# Patient Record
Sex: Male | Born: 1940 | Race: White | Hispanic: No | Marital: Single | Smoking: Former smoker
Health system: Southern US, Community
[De-identification: ages and names within clinical notes are randomized; demographics above are authoritative.]

## PROBLEM LIST (undated history)

## (undated) DIAGNOSIS — E785 Hyperlipidemia, unspecified: Secondary | ICD-10-CM

## (undated) DIAGNOSIS — R42 Dizziness and giddiness: Secondary | ICD-10-CM

## (undated) DIAGNOSIS — G629 Polyneuropathy, unspecified: Secondary | ICD-10-CM

## (undated) DIAGNOSIS — N399 Disorder of urinary system, unspecified: Secondary | ICD-10-CM

## (undated) DIAGNOSIS — F419 Anxiety disorder, unspecified: Secondary | ICD-10-CM

## (undated) DIAGNOSIS — M1990 Unspecified osteoarthritis, unspecified site: Secondary | ICD-10-CM

## (undated) HISTORY — DX: Disorder of urinary system, unspecified: N39.9

## (undated) HISTORY — DX: Polyneuropathy, unspecified: G62.9

## (undated) HISTORY — PX: ROTATOR CUFF REPAIR: SHX139

## (undated) HISTORY — DX: Unspecified osteoarthritis, unspecified site: M19.90

## (undated) HISTORY — DX: Dizziness and giddiness: R42

---

## 2004-07-18 ENCOUNTER — Inpatient Hospital Stay (HOSPITAL_COMMUNITY): Admission: EM | Admit: 2004-07-18 | Discharge: 2004-07-19 | Payer: Self-pay | Admitting: Emergency Medicine

## 2004-12-22 ENCOUNTER — Ambulatory Visit (HOSPITAL_COMMUNITY): Admission: RE | Admit: 2004-12-22 | Discharge: 2004-12-22 | Payer: Self-pay | Admitting: Psychiatry

## 2005-05-24 ENCOUNTER — Encounter: Admission: RE | Admit: 2005-05-24 | Discharge: 2005-08-22 | Payer: Self-pay | Admitting: Otolaryngology

## 2008-05-01 ENCOUNTER — Encounter: Admission: RE | Admit: 2008-05-01 | Discharge: 2008-05-01 | Payer: Self-pay | Admitting: Family Medicine

## 2009-06-20 ENCOUNTER — Emergency Department (HOSPITAL_COMMUNITY): Admission: EM | Admit: 2009-06-20 | Discharge: 2009-06-20 | Payer: Self-pay | Admitting: Emergency Medicine

## 2010-06-07 HISTORY — PX: APPENDECTOMY: SHX54

## 2010-10-23 NOTE — Discharge Summary (Signed)
NAME:  [name], [name] NO.:  [id number]   MEDICAL RECORD NO.:  [id number]          PATIENT TYPE:  INP   LOCATION:  0370                         FACILITY:  [facility]   PHYSICIAN:  Isidor Holts, M.D.  DATE OF BIRTH:  Oct 28, 1940   DATE OF ADMISSION:  07/17/2004  DATE OF DISCHARGE:                                 DISCHARGE SUMMARY   PRIMARY CARE PHYSICIAN:  Merlene Laughter. Renae Gloss, M.D.   NEUROLOGIST:  Salvatore Marvel, M.D.   DISCHARGE DIAGNOSES:  1.  Acute labyrinthitis/vestibular neuronitis.  2.  Tic.  3.  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   DISCHARGE MEDICATIONS:  1.  Prednisone tapering course, i.e., 10 mg p.o. b.i.d. for two days; then 5      mg p.o. b.i.d. for two days; then 2.5 mg p.o. b.i.d. for one day; then      stop.  2.  Motrin 600 mg p.o. t.i.d. with food for one week only.  3.  Pimozide (Orap) 2 mg p.o. b.i.d.   PROCEDURES:  1.  CT brain, dated July 17, 2004. This showed no acute abnormality.      Also noted are small bilateral maxillary sinus retention cysts.  2.  Brain MRI, dated July 18, 2004. This showed no evidence of acute or      reversible process. There were minimal small vessel changes in the      hemispheric white  matter. There is no fluid in the middle ears per      nasal sinuses or mastoids.   CONSULTATIONS:  Dr. Ellison Carwin, neurologist.   ADMISSION HISTORY:  As per H&P notes of July 18, 2004; however, in  brief, this is a 70 year old Caucasian male who presented to the emergency  department with acute onset of dizziness on the day of admission. He denies  respiratory tract symptoms, although he had, had bilateral retroauricular  aches and pains for approximately one week prior. He had prior to this, been  under the care of Dr. Salvatore Marvel, neurologist, for management of tics and  had just recently been started on Butalbarbital. Vertigo was associated with  vomiting. The patient, however, denied visual  impairment, diminution of  auditory acuity, or tinnitus. He was admitted for further investigation,  evaluation, and management.   HOSPITAL COURSE:   PROBLEM #1:  Acute labyrinthitis/vestibular neuronitis. The patient  presented with acute onset of vertigo, although there is no discernible  history of upper respiratory tract infection.  Clinical history appears  consistent with acute vestibular neuronitis/labyrinthitis. He was managed  therefore with antiemetics and steroids with satisfactory clinical response.  Associated vomiting was managed with antiemetics and intravenous fluid  hydration. On July 18, 2004, his symptoms had practically subsided. The  patient was evaluated with brain CT scan/brain MRI scan which showed no  acute intracranial abnormality or concerning lesions.   PROBLEM #2:  Tics. Patient has a rather long-term history of tics, but  apparently had only relatively been evaluated by Dr. Salvatore Marvel,  neurologist, at [facility] and had only recently been commenced on the  Butalbarbital. This was discontinued at the time  of patient's admission. It  was felt that he would benefit from a neurologic consultation and this was  kindly provided by Dr. Sharene Skeans who started the patient on pimozide. He is  to continue this until re-evaluated by Dr. Anne Hahn.   PROBLEM #3:  Bilateral retroauricular pain/ache. Initial suspicion was of  possible mastoid sinusitis. However, imaging of the mastoid region did not  show any concerning abnormalities. Certainly, the patient has no discernible  tenderness in the mastoid regions bilaterally. However, he does experience  significant discomfort on palpation of the temporomandibular joints  bilaterally aggravated on opening and closing his mouth. It is possible that  this may be associated with TMJ arthritis and it is felt that the patient  would benefit from a dental consultation, which we expect his PMD to arrange  in due course.  Meanwhile, we have commenced him on Motrin 600 mg p.o. t.i.d.  with food for one week.   DISPOSITION:  The patient is completely asymptomatic on July 19, 2004.  He is therefore being discharged in satisfactory condition, particularly as  he is very keen to go home.   PAST MEDICAL HISTORY:  Not applicable.   ACTIVITY:  As tolerated.   DIET:  No restrictions.   WOUND CARE:  Not applicable.   SPECIAL INSTRUCTIONS:  The patient is instructed to take things easy, to  rest for the next two to three days. He will also need dental consultation  for possible TMJ arthritis. His PMD is to arrange this.   FOLLOWUP:  Dr. Anne Hahn in neurology, per appointment, dated July 28, 2004.  He is also instructed to follow up with PMD Dr. Andi Devon  within two weeks. He is to call and make this appointment. The patient has  verbalized understanding.      CO/MEDQ  D:  07/19/2004  T:  07/19/2004  Job:  [id number]   cc:   Merlene Laughter. Renae Gloss, M.D.  [address]  [address]  [place]  [place] [address]  Fax: [phone]   Salvatore Marvel  [address]  [place]  [place] [address]  Fax: [phone]   Deanna Artis. Sharene Skeans, M.D.  [address]  [address]  [place]  [place] [address]  Fax: [phone]

## 2010-10-23 NOTE — H&P (Signed)
NAME:  [name], [name]               ACCOUNT NO.:  [id number]   MEDICAL RECORD NO.:  [id number]          PATIENT TYPE:  INP   LOCATION:  0101                         FACILITY:  [facility]   PHYSICIAN:  Toby L. Rogers, D.O.   DATE OF BIRTH:  [birth year]   DATE OF ADMISSION:  07/17/2004  DATE OF DISCHARGE:                                HISTORY & PHYSICAL   PRIMARY CARE PHYSICIAN:  [name], M.D.   CHIEF COMPLAINT:  Vertigo.   HISTORY OF PRESENT ILLNESS:  [name] is a 69 year old Caucasian male who  presents to the ED with acute onset of dizziness that began Friday morning  at about 3 a.m.  He had associated nausea and vomiting.  He says that he is  dizzy regardless of whether he is lying down or sitting up.  It does seem to  get worse when he sits up.  He is most comfortable when he is lying flat  with his eyes closed.  He has been complaining of pain behind both ears for  about one or two weeks.  He has had no fever or chills, no drainage from his  ears.  There is post nasal drip.  He did develop a headache once the  dizziness developed.  No ringing in his ears.  No change in hearing or  vision.  The patient was recently evaluated by [name], neurologist, due  to tics.  The patient was started on Clonidine.  Apparently the neurologist  had suggested getting an MRI as an outpatient.  Currently, the patient is  quite dizzy and is lying still in his room.  He does continue to have nausea  and vomiting.   PAST MEDICAL HISTORY/PAST SURGICAL HISTORY:  1.  Arthroscopic surgery on his left knee.  2.  History of tics.  The patient is followed by [name], neurologist.   MEDICATIONS:  1.  Multivitamin.  2.  Clonidine.   ALLERGIES:  Seafood.   SOCIAL HISTORY:  No tobacco, alcohol or IV drug abuse.  The patient is  retired.  He was a school bus driver.   FAMILY HISTORY:  Coronary artery disease and stroke.  This is on his mother  and father's side.   REVIEW OF SYSTEMS:  A  complete review of systems was performed.  The review  is negative except as stated in the HPI.   PHYSICAL EXAMINATION:  VITAL SIGNS:  Temperature 97, blood pressure 162/86,  pulse 87, respiratory rate 20, O2 saturation 100% on room air.  HEENT:  Pupils were equally round and reactive to light.  Extraocular  muscles were intact.  There is no scleral icterus.  Tympanic membranes were  clear bilaterally.  There was a small amount of fluid behind the tympanic  membrane bilaterally.  There was no erythema.  Oropharynx is clear and  moist.  There was no erythema or thrush.  There was postnasal drip.  NECK:  No JVD, no carotid bruit, no adenopathy.  HEART:  Regular rate and rhythm.  No murmurs, rubs or gallops.  LUNGS:  Clear to auscultation bilaterally.  No  wheezes, rales or rhonchi.  ABDOMEN:  Positive bowel sounds, nontender, nondistended.  EXTREMITIES:  No edema.  NEUROLOGICAL:  Cranial nerves II-XII were grossly intact.  There was no  focal deficits.  There was no nystagmus.  DTRs were 2/4 in all extremities.  Strength was 5/5 in all extremities.  The patient was unable to stand for  Romberg.   LABORATORY DATA:  A CT scan of the head showed no acute abnormality.   ASSESSMENT AND PLAN:  1.  Acute labyrinthitis.  The patient had been complaining of pain behind      both ears for about a week or two now.  This appears to be most      consistent with acute labyrinthitis.  I will start the patient on Solu-      Medrol 125 mechanical ventilation IV every six hours.  This can later be      tapered to prednisone as an outpatient.  Likely the steroids will help      with the inflammation.  This infection is likely viral.  For the      patient's nausea and vomiting, because it is so severe, I will start him      on Ativan 2 mg every four hours as needed.  In addition, he will receive      Phenergan 12.5 mg IV every six hours as needed.  I will adjust these medications throughout the night to  control his  symptoms.  In the morning, we may consider getting a neurology consult just  to see what [name] has to say about his acute onset of vertigo.  She may  very well want to get the MRI while he is an inpatient.  Also, if his  dizziness persists, we may have to entertain the idea of a stroke being the  cause of this dizziness.  1.  The patient is a full code.      TLF/MEDQ  D:  07/18/2004  T:  07/18/2004  Job:  [id number]

## 2010-10-23 NOTE — Consult Note (Signed)
NAME:  [name], [name] NO.:  [id number]   MEDICAL RECORD NO.:  [id number]          PATIENT TYPE:  INP   LOCATION:  0370                         [facility]ACILITY:  [facility]   PHYSICIAN:  Deanna Artis. Hickling, M.D.DATE OF BIRTH:  [birth year]   DATE OF CONSULTATION:  07/18/2004  DATE OF DISCHARGE:                                   CONSULTATION   CHIEF COMPLAINT:  Vertigo and motor tics.   HISTORY OF PRESENT ILLNESS:  Mr. 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  These tended to worse  as the day went on and to extend to his face to involve twitching movements  of the buccal muscles as well as to a lesser extent the eyes.  The patient  did not have any vocal tics.   When patient was younger he had a tic disorder that involved both clearing  of his throat and hard blinking of his eyes as well as other movements.  This went away in his adolescence.  He has not had problems with that until  recently.   Patient was seen earlier this week by Salvatore Marvel of [place].  She felt  that the patient was having a tic disorder and placed him on butalbital in  an attempt to try to allow him to sleep and also to set him up for an MRI  scan of the brain.  Patient awakened Friday morning and had significant  vertigo, unsteadiness in his gait and falling.  He also complained of  bifrontal headaches that he felt emanated from the frequent tics.  Patient  was admitted to the hospital for further evaluation.  I was asked to see him  to determine the etiology of his dysfunction and make recommendations for  further workup and treatment.  Working diagnosis of his vertigo and  unsteadiness is vestibular neuronitis versus labyrinthitis.  He is being  treated currently with Ativan, Phenergan and corticosteroids.   PAST MEDICAL HISTORY:  The patient has had no serious illnesses,  injuries or  hospitalizations.   PAST SURGICAL HISTORY:  Arthroscopic surgery of his left knee for bony  fragments within the knee (1981-82).   MEDICATIONS:  Clonidine, multivitamin, butalbital.   CURRENT MEDICATIONS:  1.  Ativan 2 mg q.4h. p.r.n.  2.  Phenergan 12.5 mg IV q.6h. p.r.n.  3.  Solu Medrol 125 mg IV q.6h. which was dropped down to 60 mg q.6h. today.   Brain MRI scan without and with contrast has been ordered; it is pending at  this time.   ALLERGIES:  Drug allergies are none.   ENVIRONMENTAL ALLERGIES:  IODINE and SEAFOOD.   FAMILY HISTORY:  Remarkable for coronary artery disease in his mother.  He  has had multiple stents and stroke in his father who also had hypertension  and may have had diabetes.  There are no other first-degree relatives with  tics, although he remembers that his grandfather had tics.   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  He does not  use tobacco, alcohol or drugs.  He is married.  He has an adopted daughter  who is a Consulting civil engineer at [employer].   PHYSICAL EXAMINATION:  GENERAL:  This is a pleasant gentleman in no acute  distress.  Right-handed.  VITAL SIGNS:  Temperature 97.9, blood pressure 98/59, resting pulse 67,  respirations 20, oxygen saturation 100.  Height 72 inches, weight 192  pounds.  HEENT:  No signs of infection.  NECK:  Supple, full range of motion.  No cranial or cervical bruits.  LUNGS:  Clear to auscultation.  HEART:  No murmurs.  Pulses normal.  ABDOMEN:  Soft and nontender.  Bowel sounds normal.  EXTREMITIES:  Well formed without edema, cyanosis or alterations in tone or  tight heel cords.  NEUROLOGIC:  Mental status, patient was awake and alert.  He seems to have a  little confusion in regards to the date.  Referring to his symptoms as  coming on on Saturday when clearly they came on on Friday.  He, otherwise,  was able to name objects, follow commands and repeat phrases.   Cranial nerves, round  and reactive pupils.  Fundi were normal.  He does not  have nystagmus at rest or with eye movements.  Symmetric facial strength.  Midline tongue and uvula, symmetric facial sensation.  Air conduction  greater than bone conduction.  I was not able to produce vertigo with  movement of his head.   Motor examination, normal strength, tone and mass.  Good fine motor  movements.  No pronator drift.  Sensation is intact to cold, vibration and  stereognosis.  Cerebellar examination, good finger-to-nose, rapid fine  movements.  Gait was broad based, but he did not fall.  He had a little  trouble with turning.  Deep tendon reflexes were symmetric and diminished.  Patient had bilateral flexor and plantar responses.   REVIEW OF SYSTEMS:  A 12-system review is negative for changes in appetite,  sleep.  HEAD/NECK:  No otitis media, pharyngitis, sinusitis.  LUNGS:  No  pneumonia, asthma or bronchitis.  CARDIOVASCULAR:  No hypertension, heart  attack, palpitations.  GASTROINTESTINAL:  He has some mild nausea, no  vomiting today.  He had vomiting yesterday.  GENITOURINARY:  No urinary  tract infection or hematuria.  MUSCULOSKELETAL:  No fractures, sprains or  deformities.  See past surgical history.  SKIN:  No rash or neurocutaneous  lesions.  ENDOCRINE:  No diabetes or thyroid disease.  ALLERGY/IMMUNOLOGY:  No known environment allergies.  NEURO/PSYCH:  No depression, anxiety, panic  disorder or psychosis.  NEUROLOGIC:  No diplopia, dysarthria, dysphagia,  tinnitus, syncope, weakness, numbness, tingling or loss of bowel or bladder  control.  No seizures.   IMPRESSION:  1.  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  2.  I suspect that the patient has an acute labyrinthitis or vestibular      neuronitis.  3.  Organic gait disorder, 781.2.  4.  Tension-type headaches, 784.0, related to frequent movements of his      tics.   PLAN:  Place the patient on Orap 2 mg twice daily.  If this cannot be obtained we can start  off on haloperidol 1 mg twice daily.  Clonidine was  started in an attempt to treat his tics, but his blood pressure is quite  low.  I agree also with holding butalbital.  I am not certain if the  steroids will help this vestibular neuronitis and  what the evidence is  for that, but symptomatic treatment with Phenergan and Ativan is  appropriate.  He seems to be responding to that.  I appreciate the  opportunity to see him.  I will review his MRI scan.  If any questions arise  or if I can be of assistance, do not hesitate to contact me.      WHH/MEDQ  D:  07/18/2004  T:  07/18/2004  Job:  [id number]   cc:   Isidor Holts, M.D.   Merlene Laughter. Renae Gloss, M.D.  [address]  [address]  [place]  [place] [address]  Fax: [phone]

## 2010-11-15 ENCOUNTER — Emergency Department (HOSPITAL_COMMUNITY): Payer: Medicare Other

## 2010-11-15 ENCOUNTER — Other Ambulatory Visit (INDEPENDENT_AMBULATORY_CARE_PROVIDER_SITE_OTHER): Payer: Self-pay | Admitting: General Surgery

## 2010-11-15 ENCOUNTER — Inpatient Hospital Stay (HOSPITAL_COMMUNITY)
Admission: EM | Admit: 2010-11-15 | Discharge: 2010-11-16 | DRG: 343 | Disposition: A | Discharge status: Home / Self Care | Payer: Medicare Other | Source: Ambulatory Visit | Attending: Surgery | Admitting: Surgery

## 2010-11-15 DIAGNOSIS — Z91013 Allergy to seafood: Secondary | ICD-10-CM

## 2010-11-15 DIAGNOSIS — K3580 Unspecified acute appendicitis: Principal | ICD-10-CM | POA: Diagnosis present

## 2010-11-15 LAB — SAMPLE TO BLOOD BANK

## 2010-11-15 LAB — PROTIME-INR: Prothrombin Time: 13.8 seconds (ref 11.6–15.2)

## 2010-11-15 LAB — CBC
MCH: 30.1 pg (ref 26.0–34.0)
MCHC: 35.7 g/dL (ref 30.0–36.0)
MCV: 84.4 fL (ref 78.0–100.0)
Platelets: 176 10*3/uL (ref 150–400)
RBC: 4.68 MIL/uL (ref 4.22–5.81)
RDW: 13.6 % (ref 11.5–15.5)

## 2010-11-15 LAB — DIFFERENTIAL
Eosinophils Absolute: 0.1 10*3/uL (ref 0.0–0.7)
Eosinophils Relative: 1 % (ref 0–5)
Lymphs Abs: 1.8 10*3/uL (ref 0.7–4.0)
Monocytes Absolute: 1 10*3/uL (ref 0.1–1.0)
Monocytes Relative: 9 % (ref 3–12)

## 2010-11-15 LAB — COMPREHENSIVE METABOLIC PANEL
AST: 16 U/L (ref 0–37)
CO2: 26 mEq/L (ref 19–32)
Calcium: 9 mg/dL (ref 8.4–10.5)
Creatinine, Ser: 0.87 mg/dL (ref 0.4–1.5)
GFR calc non Af Amer: >60 mL/min (ref >60)

## 2010-11-15 LAB — TYPE AND SCREEN: Antibody Screen: NEGATIVE

## 2010-11-15 LAB — URINALYSIS, ROUTINE W REFLEX MICROSCOPIC
Glucose, UA: NEGATIVE mg/dL
Hgb urine dipstick: NEGATIVE
Ketones, ur: NEGATIVE mg/dL
Protein, ur: NEGATIVE mg/dL

## 2010-11-15 MED ORDER — IOHEXOL 300 MG/ML  SOLN: 100.0000 mL | Freq: Once | INTRAMUSCULAR | Status: AC | PRN

## 2010-11-19 NOTE — Op Note (Signed)
NAME:  ZALYN, AMEND NO.:  [id number]  MEDICAL RECORD NO.:  [id number]  LOCATION:  5123                         FACILITY:  [facility]  PHYSICIAN:  Almond Lint, MD       DATE OF BIRTH:  Apr 24, 1941  DATE OF PROCEDURE:  11/15/2010 DATE OF DISCHARGE:  11/16/2010                              OPERATIVE REPORT   PREOPERATIVE DIAGNOSIS:  Acute appendicitis.  POSTOPERATIVE DIAGNOSES:  Acute appendicitis, bilateral indirect inguinal hernias.  PROCEDURE PERFORMED:  Laparoscopic appendectomy.  SURGEON:  Almond Lint, MD  ASSISTANT:  None.  ANESTHESIA:  General and local.  FINDINGS:  Very enlarged, inflamed a very enlarged, inflamed appendix.  SPECIMEN:  Appendix to Pathology.  ESTIMATED BLOOD LOSS:  Minimal.  COMPLICATIONS:  None known.  PROCEDURE:  Mr. Pembroke was identified in the holding area and taken to the operating room where he was placed supine on the operating room table.  Foley catheter was placed and general endotracheal anesthesia was induced.  His abdomen was clipped, prepped, and draped in sterile fashion.  Time-out was performed according to the surgical safety check list.  When all was correct, we continued.  The infraumbilical skin was anesthetized with local anesthetic.  A curvilinear transverse incision was made with #11 blade.  The Kelly clamp was used to spread the subcutaneous tissues.  The umbilical stalk was elevated with 2 Kocher clamps.  The fascia was incised in the midline with #11 blade.  Entrance into the peritoneal cavity was confirmed with a Kelly clamp.  A 0 Vicryl pursestring suture was placed around the fascial incision.  The Hasson was advanced into the abdomen and held in place to the abdominal wall with the tails of suture.  Pneumoperitoneum was achieved to a pressure of 50 mmHg.  The patient was placed into Trendelenburg position and rotated to the left.  Two 5-mm trocars were placed in the suprapubic region and  left lower quadrant under direct visualization with administration of local anesthetic.  The appendix was visualized and grasped.  It was very difficult to grasp because of the level of inflammation.  It was quite firm.  The mesoappendix was divided with the harmonic scalpel.  Once the base of the appendix was skeletonized, the Endo-GIA was fired across the base.  This was then removed from the umbilical port with the EndoCatch bag.  The specimen was difficult throughout.  Pneumoperitoneum was then retrieved and the appendiceal stump was examined.  There was no evidence of staple line breakdown or bleeding.  The right lower quadrant was irrigated.  Of note, the appendix was adherent to the right lateral abdominal wall prior to its removal.  The pelvis was irrigated as well. The two 5-mm trocars were removed under direct visualization with no evidence of bleeding from the abdominal wall.  The pneumoperitoneum was allowed to evacuate through the Hasson.  Hasson was then removed.  The pursestring suture was tied down and there was no residual palpable fascial defect.  The skin of all incisions were closed with 4-0 Monocryl in subcuticular fashion.  The wound was then cleaned, dried, and dressed with Dermabond.  The patient was awaken from anesthesia and taken to PACU  in stable condition.  Needle, sponge, and instrument counts were correct.     Almond Lint, MD     FB/MEDQ  D:  11/15/2010  T:  11/16/2010  Job:  [id number]  Electronically Signed by Almond Lint MD on 11/19/2010 02:00:33 PM

## 2010-11-19 NOTE — H&P (Signed)
  NAME:  [name], MAZZUCA NO.:  [id number]  MEDICAL RECORD NO.:  [id number]  LOCATION:  5123                         FACILITY:  [facility]  PHYSICIAN:  Almond Lint, MD       DATE OF BIRTH:  [birth year]  DATE OF ADMISSION:  11/15/2010 DATE OF DISCHARGE:  11/16/2010                             HISTORY & PHYSICAL   CHIEF COMPLAINT:  Right lower quadrant pain.  HISTORY OF PRESENT ILLNESS:  [name] is a 70 year old male who had around 3 days of right-sided abdominal pain.  He also had decreased appetite and some nausea.  He refers all this to gas because he did feel quite bloated.  He tried taking an antibiotic that he had had for a urinary tract infection and this did not get better.  He today developed rigors and sweats.  The pain is continued to get worse and worse and now hurts when he moves or when anyone touches his abdomen.  He has not had any relief with the pain until he got some pain medications in the emergency department and this did not make it completely go away.  PAST MEDICAL HISTORY:  Significant for arthritis.  PAST SURGICAL HISTORY:  He has had a rotator cuff repair and he has had liposuction.  FAMILY HISTORY:  Significant for coronary artery disease and CVA.  SOCIAL HISTORY:  He is a retired Midwife and he has been in [employer] in the past.  ALLERGIES:  He is allergic to SEAFOOD.  MEDICATIONS:  Multivitamin and Celebrex.  REVIEW OF SYSTEMS:  Otherwise negative x11 systems.  PHYSICAL EXAMINATION:  VITAL SIGNS:  Temperature 98.5, pulse 80, respiratory rate 18, blood pressure 135/79. GENERAL:  He is alert and oriented x3, looking uncomfortable. HEENT:  Normocephalic, atraumatic.  Sclerae are anicteric. NECK:  Supple.  No lymphadenopathy.  No thyromegaly.  Trachea is midline. HEART:  Regular rate and rhythm.  No murmurs, rubs, or gallops. ABDOMEN:  Soft, slightly distended, tender everywhere but was dramatically worse in the right lower  quadrant.  He has a positive Rovsing's sign.  He has voluntary guarding and rebound. EXTREMITIES:  Warm, well perfused without pitting edema. SKIN:  No evidence of rashes. NEURO:  No gross motor or sensory deficits systems.  STUDIES:  Sodium 138, potassium 3.8, chloride 102, CO2 26, BUN 15, creatinine 0.87, glucose 94, BUN 0.7, AST 16, ALT 23, alk phos 102. White count 11.5, hemoglobin 14.1, hematocrit of 39.5, platelet count 176,000.  UA is negative.  CT is positive for appendicitis.  IMPRESSION:  [name] is a 70 year old male with acute appendicitis.  PLAN:  IV fluids, IV antibiotics, n.p.o., and laparoscopic appendectomy. Risks and benefits of the surgery were described to the patient.  He understands and wish to proceed.  He was advised that there could be bleeding, infection, damage to other structures, and he is certainly at the risk of developing postoperative abscess.     Almond Lint, MD     FB/MEDQ  D:  11/15/2010  T:  11/16/2010  Job:  [id number]  Electronically Signed by Almond Lint MD on 11/19/2010 02:01:26 PM

## 2011-07-14 ENCOUNTER — Ambulatory Visit (INDEPENDENT_AMBULATORY_CARE_PROVIDER_SITE_OTHER): Payer: Medicare Other | Admitting: Cardiology

## 2011-07-14 ENCOUNTER — Encounter: Payer: Self-pay | Admitting: Cardiology

## 2011-07-14 DIAGNOSIS — I10 Essential (primary) hypertension: Secondary | ICD-10-CM

## 2011-07-14 DIAGNOSIS — R079 Chest pain, unspecified: Secondary | ICD-10-CM | POA: Insufficient documentation

## 2011-07-14 NOTE — Assessment & Plan Note (Signed)
Pleuritic and positional chest pain.  It has been present for 2 week and is actually getting better today.  It is nonexertional.  It is very atypical for angina.  Given his history of DVT, I checked a D dimer today in the office.  It was normal.  Pericarditis would also be a possibility.  ECG is not easily interpretable for this (has RBBB/LAFB).  As pain has mostly resolved today, I do not see a need to treat with high dose NSAIDs.  I will have him continue ASA 81 mg daily (which he has started).  I do not think there is an indication for stress testing.  I will get an echocardiogram to make sure that the heart is structurally normal and that there is no significant pericardial effusion.  I will see him back in 2 wks to make sure that symptoms have resolved.

## 2011-07-14 NOTE — Patient Instructions (Signed)
Your physician recommends that you have lab work today---D-Dimer--STAT.  Your physician has requested that you have an echocardiogram. Echocardiography is a painless test that uses sound waves to create images of your heart. It provides your doctor with information about the size and shape of your heart and how well your heart's chambers and valves are working. This procedure takes approximately one hour. There are no restrictions for this procedure.  Your physician recommends that you schedule a follow-up appointment in: 2-3 weeks with Dr Shirlee Latch.

## 2011-07-14 NOTE — Progress Notes (Signed)
PCP: Dr. Drue Second, Kathryne Sharper  71 yo with history of hyperlipidemia and HTN presents for cardiology evaluation due to chest pain.  About 2 weeks ago, patient woke up at night with a left lateral upper chest pain.  The pain is pleuritic.  It has been constant since that night but is actually a lot better today.  Pain has also been worse when he lies down.  He has not had any shortness of breath.  The pain has not been worse with exertion.  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  He does not remember injuring himself in any particular way. He has not had a fever or cough.  He has a distant h/o DVT but no leg swelling.  No recent long trips.   ECG: NSR, LAFB, RBBB  Labs (1/13): K 4.2, creatinine 1.03, LDL 99, HDL 37, HCT 44.4  PMH: 1.  Hyperlipidemia 2. Tourette's syndrome 3. H/o vertigo 4. Diverticulosis 5. Appendectomy 6. HTN 7. DVT 15 yrs ago.   SH: Married with children.  Lives in [place].  Retired Hotel manager.  Wife is [place], has a house in [place] and travels there once a year.  Quit smoking in 1984.  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    FH: Father with Alzheimers, mother with h/o CAD (PCI at age 1)  ROS: All systems reviewed and negative except as per HPI.   Current Outpatient Prescriptions  Medication Sig Dispense Refill  . Ascorbic Acid (VITAMIN C PO) Take 300 mg by mouth daily. 1/2 IN MORNING AND 1/2 AT NIGHT      . atorvastatin (LIPITOR) 20 MG tablet Take 20 mg by mouth daily.      . celecoxib (CELEBREX) 200 MG capsule Take 200 mg by mouth daily.      . cloNIDine (CATAPRES) 0.1 MG tablet Take 0.1 mg by mouth 3 (three) times daily.      . fish oil-omega-3 fatty acids 1000 MG capsule Take 2 g by mouth daily. FOUR TIMES DAILY      . GLUCOSAMINE PO Take by mouth daily.      . NON FORMULARY 505 MG THREE TIMES DAILY      . aspirin (ASPIRIN CHILDRENS) 81 MG chewable tablet One tablet daily        BP 148/90  Pulse 78  Ht 6'  (1.829 m)  Wt 100.426 kg (221 lb 6.4 oz)  BMI 30.03 kg/m2 General: NAD Neck: No JVD, no thyromegaly or thyroid nodule.  Lungs: Clear to auscultation bilaterally with normal respiratory effort. CV: Nondisplaced PMI.  Heart regular S1/S2, no S3/S4, no murmur, no friction rub.  No peripheral edema.  No carotid bruit.  Normal pedal pulses.  Abdomen: Soft, nontender, no hepatosplenomegaly, no distention.  Skin: Intact without lesions or rashes.  Neurologic: Alert and oriented x 3.  Psych: Normal affect. Extremities: No clubbing or cyanosis.  HEENT: Normal.

## 2011-07-14 NOTE — Assessment & Plan Note (Signed)
He is taking clonidine only for HTN.  This is not a typical first-line regimen.  I would ideally like to get him off this and onto a different agent.  We will address this when I see him back.

## 2011-07-16 NOTE — Progress Notes (Signed)
Addended by: Judithe Modest D on: 07/16/2011 10:59 AM   Modules accepted: Orders

## 2011-07-21 ENCOUNTER — Other Ambulatory Visit: Payer: Self-pay

## 2011-07-21 ENCOUNTER — Ambulatory Visit (HOSPITAL_COMMUNITY): Payer: Medicare Other | Attending: Cardiovascular Disease

## 2011-07-21 DIAGNOSIS — R072 Precordial pain: Secondary | ICD-10-CM

## 2011-07-21 DIAGNOSIS — Z87891 Personal history of nicotine dependence: Secondary | ICD-10-CM | POA: Insufficient documentation

## 2011-07-21 DIAGNOSIS — Z86718 Personal history of other venous thrombosis and embolism: Secondary | ICD-10-CM | POA: Insufficient documentation

## 2011-07-21 DIAGNOSIS — E785 Hyperlipidemia, unspecified: Secondary | ICD-10-CM | POA: Insufficient documentation

## 2011-07-21 DIAGNOSIS — I4510 Unspecified right bundle-branch block: Secondary | ICD-10-CM | POA: Insufficient documentation

## 2011-07-21 DIAGNOSIS — R079 Chest pain, unspecified: Secondary | ICD-10-CM | POA: Insufficient documentation

## 2011-07-21 DIAGNOSIS — I10 Essential (primary) hypertension: Secondary | ICD-10-CM | POA: Insufficient documentation

## 2011-08-09 ENCOUNTER — Encounter: Payer: Self-pay | Admitting: Cardiology

## 2011-08-09 ENCOUNTER — Ambulatory Visit (INDEPENDENT_AMBULATORY_CARE_PROVIDER_SITE_OTHER): Payer: Medicare Other | Admitting: Cardiology

## 2011-08-09 DIAGNOSIS — I10 Essential (primary) hypertension: Secondary | ICD-10-CM

## 2011-08-09 DIAGNOSIS — R079 Chest pain, unspecified: Secondary | ICD-10-CM

## 2011-08-09 MED ORDER — LISINOPRIL 10 MG PO TABS: 10.0000 mg | ORAL_TABLET | Freq: Every day | ORAL | Status: DC

## 2011-08-09 NOTE — Patient Instructions (Signed)
Start lisinopril 10mg  daily.  Your physician recommends that you return for lab work in: 2 weeks--BMET 401.9  786.50  Take and record your blood pressure daily. I will call you in a month to get the readings. [name] [phone]  Your physician wants you to follow-up in: 1 year with Dr Shirlee Latch. (March 2014). You will receive a reminder letter in the mail two months in advance. If you don't receive a letter, please call our office to schedule the follow-up appointment.

## 2011-08-10 NOTE — Assessment & Plan Note (Signed)
Pleuritic and positional chest pain that was nonexertional and lasted for about 2 weeks (constant).  It was very atypical for angina.  Given his history of DVT, I checked a D dimer today in the office which was normal.  Pain has completely resolved.  This episode certainly may have been acute pericarditis.  Echo showed normal EF with mild MR.  There was no pericardial effusion noted.  No further workup at this time as he is completely asymptomatic.

## 2011-08-10 NOTE — Progress Notes (Signed)
PCP: Dr. Drue Second, Kathryne Sharper  71 yo with history of hyperlipidemia and HTN presented for cardiology evaluation due to chest pain.  Several weeks ago, patient woke up at night with a left lateral upper chest pain.  The pain was pleuritic and constant for a number of days.  It was when he would lie down.  He has not had any shortness of breath.  The pain was not worse with exertion.  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  He does not remember injuring himself in any particular way. He has not had a fever or cough.  He has a distant h/o DVT but no leg swelling.  No recent long trips. D dimer was negative at last office visit.   Since I saw him last, the pain has totally resolved.  Echo showed normal EF with mild MR, no pericardial effusion.  BP continues to run high.   Labs (1/13): K 4.2, creatinine 1.03, LDL 99, HDL 37, HCT 44.4 Labs (2/13): D dimer negative  PMH: 1.  Hyperlipidemia 2. Tourette's syndrome 3. H/o vertigo 4. Diverticulosis 5. Appendectomy 6. HTN 7. DVT 15 yrs ago.  8. Echo (2/13): EF 55-60%, mild MR  SH: Married with children.  Lives in [place].  Retired Hotel manager.  Wife is [place], has a house in [place] and travels there once a year.  Quit smoking in 1984.  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    FH: Father with Alzheimers, mother with h/o CAD (PCI at age 34)  ROS: All systems reviewed and negative except as per HPI.   Current Outpatient Prescriptions  Medication Sig Dispense Refill  . Ascorbic Acid (VITAMIN C PO) Take 300 mg by mouth daily. 1/2 IN MORNING AND 1/2 AT NIGHT      . aspirin (ASPIRIN CHILDRENS) 81 MG chewable tablet One tablet daily      . atorvastatin (LIPITOR) 20 MG tablet Take 20 mg by mouth daily.      . celecoxib (CELEBREX) 200 MG capsule Take 200 mg by mouth daily.      . cloNIDine (CATAPRES) 0.1 MG tablet Take 0.1 mg by mouth 3 (three) times daily.      . cyanocobalamin 500 MCG tablet Take 1,000  mcg by mouth 2 (two) times daily.       . fish oil-omega-3 fatty acids 1000 MG capsule Take 2 g by mouth daily. FOUR TIMES DAILY       . lisinopril (PRINIVIL,ZESTRIL) 10 MG tablet Take 1 tablet (10 mg total) by mouth daily.  90 tablet  3    BP 158/84  Pulse 75  Ht 6' (1.829 m)  Wt 226 lb (102.513 kg)  BMI 30.65 kg/m2 General: NAD Neck: No JVD, no thyromegaly or thyroid nodule.  Lungs: Clear to auscultation bilaterally with normal respiratory effort. CV: Nondisplaced PMI.  Heart regular S1/S2, no S3/S4, no murmur, no friction rub.  No peripheral edema.  No carotid bruit.  Normal pedal pulses.  Abdomen: Soft, nontender, no hepatosplenomegaly, no distention.  Neurologic: Alert and oriented x 3.  Psych: Normal affect. Extremities: No clubbing or cyanosis.

## 2011-08-10 NOTE — Assessment & Plan Note (Signed)
BP is running high.  He is taking clonidine once a day, which he was put on for tics (not BP control).  I will add lisinopril 10 mg daily with BMET and BP check in 2 wks.    Followup in 1 year.

## 2011-08-24 ENCOUNTER — Other Ambulatory Visit: Payer: Medicare Other

## 2011-09-08 ENCOUNTER — Telehealth: Payer: Self-pay | Admitting: *Deleted

## 2011-09-08 NOTE — Telephone Encounter (Signed)
Hypertension - [name], MD 08/10/2011 8:26 AM Signed  BP is running high. He is taking clonidine once a day, which he was put on for tics (not BP control). I will add lisinopril 10 mg daily with BMET and BP check in 2 wks.  09/08/11--LMTCB for pt.

## 2011-09-10 NOTE — Telephone Encounter (Signed)
Spoke with pt. Pt states his BP had been in 132/70 range. He did not start lisinopril.

## 2011-09-11 NOTE — Telephone Encounter (Signed)
Monitor BP.  If SBP running > 140 regularly, would start the lisinopril and let me know b/c will need labs.

## 2011-09-14 NOTE — Telephone Encounter (Signed)
Discussed with pt. Pt states BP this morning was 125/75. He is aware of Dr Alford Highland recommendation to start lisinopril and call if systolic BP regularly > 140 so we can schedule lab 2 weeks after starting lisinopril.

## 2011-10-21 ENCOUNTER — Encounter: Payer: Self-pay | Admitting: Cardiology

## 2012-02-27 ENCOUNTER — Emergency Department (HOSPITAL_COMMUNITY): Payer: Medicare Other

## 2012-02-27 ENCOUNTER — Observation Stay (HOSPITAL_COMMUNITY)
Admission: EM | Admit: 2012-02-27 | Discharge: 2012-02-28 | Disposition: A | Discharge status: Home / Self Care | Payer: Medicare Other | Attending: Internal Medicine | Admitting: Internal Medicine

## 2012-02-27 ENCOUNTER — Other Ambulatory Visit: Payer: Self-pay

## 2012-02-27 ENCOUNTER — Encounter (HOSPITAL_COMMUNITY): Payer: Self-pay | Admitting: *Deleted

## 2012-02-27 DIAGNOSIS — R0602 Shortness of breath: Secondary | ICD-10-CM | POA: Insufficient documentation

## 2012-02-27 DIAGNOSIS — E785 Hyperlipidemia, unspecified: Secondary | ICD-10-CM | POA: Insufficient documentation

## 2012-02-27 DIAGNOSIS — I10 Essential (primary) hypertension: Secondary | ICD-10-CM

## 2012-02-27 DIAGNOSIS — R071 Chest pain on breathing: Principal | ICD-10-CM | POA: Insufficient documentation

## 2012-02-27 DIAGNOSIS — G629 Polyneuropathy, unspecified: Secondary | ICD-10-CM | POA: Diagnosis present

## 2012-02-27 DIAGNOSIS — F411 Generalized anxiety disorder: Secondary | ICD-10-CM

## 2012-02-27 DIAGNOSIS — G609 Hereditary and idiopathic neuropathy, unspecified: Secondary | ICD-10-CM | POA: Insufficient documentation

## 2012-02-27 DIAGNOSIS — R079 Chest pain, unspecified: Secondary | ICD-10-CM

## 2012-02-27 DIAGNOSIS — R209 Unspecified disturbances of skin sensation: Secondary | ICD-10-CM | POA: Insufficient documentation

## 2012-02-27 DIAGNOSIS — F419 Anxiety disorder, unspecified: Secondary | ICD-10-CM

## 2012-02-27 HISTORY — DX: Anxiety disorder, unspecified: F41.9

## 2012-02-27 HISTORY — DX: Hyperlipidemia, unspecified: E78.5

## 2012-02-27 LAB — URINALYSIS, ROUTINE W REFLEX MICROSCOPIC
Bilirubin Urine: NEGATIVE
Glucose, UA: NEGATIVE mg/dL
Hgb urine dipstick: NEGATIVE
Ketones, ur: NEGATIVE mg/dL
Leukocytes, UA: NEGATIVE
Protein, ur: NEGATIVE mg/dL
pH: 7.5 (ref 5.0–8.0)

## 2012-02-27 LAB — CBC
HCT: 40.7 % (ref 39.0–52.0)
MCH: 29.5 pg (ref 26.0–34.0)
MCV: 86.4 fL (ref 78.0–100.0)
Platelets: 169 10*3/uL (ref 150–400)
RBC: 4.71 MIL/uL (ref 4.22–5.81)
WBC: 6.2 10*3/uL (ref 4.0–10.5)

## 2012-02-27 LAB — BASIC METABOLIC PANEL
BUN: 19 mg/dL (ref 6–23)
CO2: 26 mEq/L (ref 19–32)
Calcium: 9.7 mg/dL (ref 8.4–10.5)
Chloride: 103 mEq/L (ref 96–112)
Creatinine, Ser: 0.98 mg/dL (ref 0.50–1.35)
Glucose, Bld: 120 mg/dL — ABNORMAL HIGH (ref 70–99)

## 2012-02-27 MED ORDER — ASPIRIN 81 MG PO CHEW: 81.0000 mg | CHEWABLE_TABLET | Freq: Every day | ORAL | Status: DC

## 2012-02-27 MED ORDER — ALBUTEROL SULFATE (5 MG/ML) 0.5% IN NEBU: 2.5000 mg | INHALATION_SOLUTION | RESPIRATORY_TRACT | Status: DC | PRN

## 2012-02-27 MED ORDER — SENNOSIDES-DOCUSATE SODIUM 8.6-50 MG PO TABS
1.0000 | ORAL_TABLET | Freq: Every evening | ORAL | Status: DC | PRN
  Filled 2012-02-27: qty 1

## 2012-02-27 MED ORDER — SODIUM CHLORIDE 0.9 % IV BOLUS (SEPSIS)
250.0000 mL | Freq: Once | INTRAVENOUS | Status: AC
  Administered 2012-02-27: 250 mL via INTRAVENOUS

## 2012-02-27 MED ORDER — ATORVASTATIN CALCIUM 10 MG PO TABS
10.0000 mg | ORAL_TABLET | Freq: Every day | ORAL | Status: DC
  Filled 2012-02-27 (×2): qty 1

## 2012-02-27 MED ORDER — SODIUM CHLORIDE 0.9 % IV SOLN
INTRAVENOUS | Status: AC
  Administered 2012-02-27: 23:00:00 via INTRAVENOUS

## 2012-02-27 MED ORDER — SODIUM CHLORIDE 0.9 % IJ SOLN
3.0000 mL | Freq: Two times a day (BID) | INTRAMUSCULAR | Status: DC
  Administered 2012-02-27: 3 mL via INTRAVENOUS

## 2012-02-27 MED ORDER — METOPROLOL TARTRATE 25 MG PO TABS
25.0000 mg | ORAL_TABLET | Freq: Two times a day (BID) | ORAL | Status: DC
  Administered 2012-02-27: 25 mg via ORAL
  Filled 2012-02-27 (×3): qty 1

## 2012-02-27 MED ORDER — ONDANSETRON HCL 4 MG/2ML IJ SOLN: 4.0000 mg | Freq: Four times a day (QID) | INTRAMUSCULAR | Status: DC | PRN

## 2012-02-27 MED ORDER — HYDROCODONE-ACETAMINOPHEN 5-325 MG PO TABS: 1.0000 | ORAL_TABLET | ORAL | Status: DC | PRN

## 2012-02-27 MED ORDER — GUAIFENESIN-DM 100-10 MG/5ML PO SYRP: 5.0000 mL | ORAL_SOLUTION | ORAL | Status: DC | PRN

## 2012-02-27 MED ORDER — ONDANSETRON HCL 4 MG PO TABS: 4.0000 mg | ORAL_TABLET | Freq: Four times a day (QID) | ORAL | Status: DC | PRN

## 2012-02-27 MED ORDER — ENOXAPARIN SODIUM 40 MG/0.4ML ~~LOC~~ SOLN
40.0000 mg | SUBCUTANEOUS | Status: DC
  Filled 2012-02-27 (×2): qty 0.4

## 2012-02-27 MED ORDER — NITROGLYCERIN 0.4 MG SL SUBL
0.4000 mg | SUBLINGUAL_TABLET | SUBLINGUAL | Status: DC | PRN
  Filled 2012-02-27: qty 25

## 2012-02-27 MED ORDER — SODIUM CHLORIDE 0.9 % IV SOLN
INTRAVENOUS | Status: DC
  Administered 2012-02-27: 20:00:00 via INTRAVENOUS

## 2012-02-27 MED ORDER — LISINOPRIL 10 MG PO TABS
10.0000 mg | ORAL_TABLET | Freq: Every day | ORAL | Status: DC
  Filled 2012-02-27: qty 1

## 2012-02-27 NOTE — ED Notes (Addendum)
Patient with onset of chest pain 30 min ago.  Patient stumbled into the ED, holding his chest, pasty in color.  Patient concerned about his bp, grabbing his chest.  Patient states he took aspirin at 1800.  Patient states he took bp medication today due to having high bp.  Patient appears very anxious.  Encouraged to calm and take deep breaths.  Patient states his chest pain is resolving during triage

## 2012-02-27 NOTE — ED Notes (Signed)
Patient currently sitting up in bed; no respiratory or acute distress noted.  Patient updated on plan of care; informed patient that EDP has made consult to cardiologist and internal medicine.  Patient has no other questions or concerns at this time; will continue to monitor.

## 2012-02-27 NOTE — H&P (Signed)
[facility]                                                                                    Patient Demographics  [name], is a 71 y.o. male  CSN: [id number]  MRN: [id number]  DOB - 08/07/1940  Admit Date - 02/27/2012  Outpatient Primary MD for the patient is Dalbert Mayotte, MD   With History of -  Past Medical History  Diagnosis Date  . Other urinary problems   . Chest pain   . Arthritis   . Neuropathy   . Dizziness   . Dyslipidemia   . Anxiety       Past Surgical History  Procedure Date  . Rotator cuff repair   . Appendectomy 2012    in for   Chief Complaint  Patient presents with  . Chest Pain  . Shortness of Breath  . Numbness     HPI  [name]  is a 71 y.o. male, history of high blood pressure, high cholesterol, anxiety, who sees Dr.Dalton Ronelle Nigh for high blood pressure and nonspecific chest, comes to the hospital with one hour history of substernal chest discomfort which he describes as pressure like sensation at times radiating to his neck and jaw, pain happened this afternoon after patient had ridden his lawnmower for an hour, he says the pain made him short of breath, no aggravating or relieving factors, came to the ER where he received some sublingual nitroglycerin which resolved his chest, EKG shows right bundle branch block point-of-care troponin was negative. Case was discussed with cardiology fellow by the ER physician who requested hospitalist to admit and that he would do a stress test in the morning.    Review of Systems  currently negative review of systems  In addition to the HPI above,  No Fever-chills, No Headache, No changes with Vision or hearing, No problems swallowing food or Liquids, No Chest pain, Cough or Shortness of Breath, No Abdominal pain, No Nausea or Vommitting, Bowel movements are regular, No Blood in stool or Urine, No dysuria, No new skin rashes or bruises, No new joints pains-aches,  No  new weakness, tingling, numbness in any extremity, No recent weight gain or loss, No polyuria, polydypsia or polyphagia, No significant Mental Stressors.  A full 10 point Review of Systems was done, except as stated above, all other Review of Systems were negative.   Social History History  Substance Use Topics  . Smoking status: Former Games developer  . Smokeless tobacco: Not on file  . Alcohol Use: No     Family History Family History  Problem Relation Age of Onset  . Heart disease Mother      Prior to Admission medications   Medication Sig Start Date End Date Taking? Authorizing Provider  atorvastatin (LIPITOR) 20 MG tablet Take 10 mg by mouth daily.    Yes Historical Provider, MD  celecoxib (CELEBREX) 200 MG capsule Take 200 mg by mouth daily.   Yes Historical Provider, MD  cloNIDine (CATAPRES) 0.1 MG tablet Take 0.1 mg by mouth 2 (two) times daily.    Yes Historical Provider, MD  fish oil-omega-3 fatty acids 1000 MG  capsule Take 1 g by mouth 2 (two) times daily.    Yes Historical Provider, MD  lisinopril (PRINIVIL,ZESTRIL) 10 MG tablet Take 10 mg by mouth daily. 08/09/11 08/08/12 Yes [name], MD  OVER THE COUNTER MEDICATION Take 2 tablets by mouth 2 (two) times daily. neuopathy formula vitamin   Yes Historical Provider, MD    Allergies  Allergen Reactions  . Shellfish Allergy Anaphylaxis    Physical Exam  Vitals  Blood pressure 112/69, pulse 68, temperature 97.9 F (36.6 C), temperature source Oral, resp. rate 15, height 6' (1.829 m), weight 95.255 kg (210 lb), SpO2 96.00%.   1. General middle-aged Caucasian male lying in bed in NAD,  has chronic constant twitching  2. Anxious affect and insight, Not Suicidal or Homicidal, Awake Alert, Oriented X 3.  3. No F.N deficits, ALL C.Nerves Intact, Strength 5/5 all 4 extremities, Sensation intact all 4 extremities, Plantars down going.  4. Ears and Eyes appear Normal, Conjunctivae clear, PERRLA. Moist Oral Mucosa.  5.  Supple Neck, No JVD, No cervical lymphadenopathy appriciated, No Carotid Bruits.  6. Symmetrical Chest wall movement, Good air movement bilaterally, CTAB.  7. RRR, No Gallops, Rubs or Murmurs, No Parasternal Heave.  8. Positive Bowel Sounds, Abdomen Soft, Non tender, No organomegaly appriciated,No rebound -guarding or rigidity.  9.  No Cyanosis, Normal Skin Turgor, No Skin Rash or Bruise.  10. Good muscle tone,  joints appear normal , no effusions, Normal ROM.  11. No Palpable Lymph Nodes in Neck or Axillae     Data Review  CBC  Lab 02/27/12 1832  WBC 6.2  HGB 13.9  HCT 40.7  PLT 169  MCV 86.4  MCH 29.5  MCHC 34.2  RDW 13.3  LYMPHSABS --  MONOABS --  EOSABS --  BASOSABS --  BANDABS --   ------------------------------------------------------------------------------------------------------------------  Chemistries   Lab 02/27/12 1832  NA 139  K 4.0  CL 103  CO2 26  GLUCOSE 120*  BUN 19  CREATININE 0.98  CALCIUM 9.7  MG --  AST --  ALT --  ALKPHOS --  BILITOT --   ------------------------------------------------------------------------------------------------------------------ estimated creatinine clearance is 84 ml/min (by C-G formula based on Cr of 0.98). ------------------------------------------------------------------------------------------------------------------ No results found for this basename: TSH,T4TOTAL,FREET3,T3FREE,THYROIDAB in the last 72 hours   Coagulation profile No results found for this basename: INR:5,PROTIME:5 in the last 168 hours ------------------------------------------------------------------------------------------------------------------- No results found for this basename: DDIMER:2 in the last 72 hours -------------------------------------------------------------------------------------------------------------------  Cardiac Enzymes No results found for this basename: CK:3,CKMB:3,TROPONINI:3,MYOGLOBIN:3 in the last 168  hours ------------------------------------------------------------------------------------------------------------------ No components found with this basename: POCBNP:3   ---------------------------------------------------------------------------------------------------------------  Urinalysis    Component Value Date/Time   COLORURINE YELLOW 02/27/2012 2015   APPEARANCEUR CLEAR 02/27/2012 2015   LABSPEC 1.022 02/27/2012 2015   PHURINE 7.5 02/27/2012 2015   GLUCOSEU NEGATIVE 02/27/2012 2015   HGBUR NEGATIVE 02/27/2012 2015   BILIRUBINUR NEGATIVE 02/27/2012 2015   KETONESUR NEGATIVE 02/27/2012 2015   PROTEINUR NEGATIVE 02/27/2012 2015   UROBILINOGEN 1.0 02/27/2012 2015   NITRITE NEGATIVE 02/27/2012 2015   LEUKOCYTESUR NEGATIVE 02/27/2012 2015      Imaging results:   Dg Chest Port 1 View  02/27/2012  *RADIOLOGY REPORT*  Clinical Data: 71 year old male chest pain shortness of breath numbness dizziness.  PORTABLE CHEST - 1 VIEW  Comparison: 11/15/2010 and earlier.  Findings: AP portable semi upright view 1856 hours.  Stable lung volumes.  Cardiac size and mediastinal contours are within normal limits.  Visualized tracheal air column is within  normal limits. No pneumothorax, pulmonary edema, pleural effusion or confluent pulmonary opacity.  IMPRESSION: No acute cardiopulmonary abnormality.   Original Report Authenticated By: [name], M.D.     My personal review of EKG: Rhythm NSR, RBBB Rate  82 /min, non specific ST changes     Assessment & Plan   1. Chest pain- will keep the patient on 23 hour observation on a telemetry bed, rule out MI with serial troponins, place him on low-dose beta blocker, he is already taking full dose aspirin at home will continue baby aspirin from tomorrow, echogram to evaluate for wall motion, cardiology fellow to see he has told the ER physician that stress test is likely in the morning, keep him n.p.o. after midnight. When necessary sublingual nitroglycerin if  pain recurs, will be placed on heparin drip if troponins become positive or he develops unstable angina.   2. Hypertension and dyslipidemia no acute issues we'll continue his home dose ACE inhibitor and statin, we'll add low-dose beta blocker.   3. History of anxiety, neuropathy, chronic twitching- no acute issues outpatient followup with Dr. Ilsa Iha his primary care physician.   DVT Prophylaxis  Lovenox    AM Labs Ordered, also please review Full Orders  Family Communication: Admission, patients condition and plan of care including tests being ordered have been discussed with the patient  who indicates understanding and agree with the plan and Code Status.  Code Status Full  Disposition Plan: Home  Time spent in minutes : 35  Condition Marinell Blight K M.D on 02/27/2012 at 8:43 PM  Between 7am to 7pm - Pager - [phone]  After 7pm go to [email or web address] - password [email or web address]  And look for the night coverage person covering me after hours  [facility] Office  [phone]

## 2012-02-27 NOTE — ED Notes (Signed)
Calling report now. 

## 2012-02-27 NOTE — ED Notes (Signed)
Gave report to [place], RN on 3000.  No further questions/concerns from RN; informed RN that she can call back with any questions/concerns once patient arrives to floor.  Preparing patient for transport.

## 2012-02-27 NOTE — ED Notes (Addendum)
Received bedside report from [place], [place].  Patient currently sitting up in bed; no respiratory or acute distress noted.  Patient updated on plan of care; informed patient that we currently need a urine sample -- patient given urinal.  Dr. Deretha Emory currently at bedside; will continue to monitor.

## 2012-02-27 NOTE — ED Provider Notes (Addendum)
History     CSN: [id number]  Arrival date & time 02/27/12  4098   First MD Initiated Contact with Patient 02/27/12 1846      Chief Complaint  Patient presents with  . Chest Pain  . Shortness of Breath  . Numbness    (Consider location/radiation/quality/duration/timing/severity/associated sxs/prior treatment) The history is provided by the patient and the spouse.   patient is a 71 year old male acute onset of substernal chest pain radiating into the neck starting approximately one hour prior to the time that I saw him probably about 45 minutes prior to arrival to the emergency department. Patient did take a full aspirin at home. Has never had pain like this before. The pain was substernal described as aching burning. Did radiate into the neck into the left shoulder. Associated with shortness of breath some dizziness no diaphoresis some nausea. Patient has been followed by of our cardiology in the past.  Chest pain described as 10 out of 10 initially currently now more 5-4/10.  Past Medical History  Diagnosis Date  . Other urinary problems   . Chest pain   . Arthritis   . Neuropathy   . Dizziness     Past Surgical History  Procedure Date  . Rotator cuff repair   . Appendectomy 2012    Family History  Problem Relation Age of Onset  . Heart disease Mother     History  Substance Use Topics  . Smoking status: Former Games developer  . Smokeless tobacco: Not on file  . Alcohol Use: No      Review of Systems  Constitutional: Negative for fever.  HENT: Positive for neck pain.   Respiratory: Positive for shortness of breath.   Cardiovascular: Positive for chest pain.  Gastrointestinal: Positive for nausea. Negative for vomiting and abdominal pain.  Genitourinary: Negative for dysuria.  Musculoskeletal: Negative for back pain.  Skin: Negative for rash.  Neurological: Positive for dizziness. Negative for headaches.  Hematological: Does not bruise/bleed easily.    Psychiatric/Behavioral: Negative for confusion.    Allergies  Shellfish allergy  Home Medications   Current Outpatient Rx  Name Route Sig Dispense Refill  . ATORVASTATIN CALCIUM 20 MG PO TABS Oral Take 10 mg by mouth daily.     . CELECOXIB 200 MG PO CAPS Oral Take 200 mg by mouth daily.    Marland Kitchen CLONIDINE HCL 0.1 MG PO TABS Oral Take 0.1 mg by mouth 2 (two) times daily.     . OMEGA-3 FATTY ACIDS 1000 MG PO CAPS Oral Take 1 g by mouth 2 (two) times daily.     Marland Kitchen LISINOPRIL 10 MG PO TABS Oral Take 10 mg by mouth daily.    Marland Kitchen OVER THE COUNTER MEDICATION Oral Take 2 tablets by mouth 2 (two) times daily. neuopathy formula vitamin      BP 155/89  Pulse 77  Temp 97.9 F (36.6 C) (Oral)  Resp 14  Ht 6' (1.829 m)  Wt 210 lb (95.255 kg)  BMI 28.48 kg/m2  SpO2 100%  Physical Exam  Nursing note and vitals reviewed. Constitutional: He is oriented to person, place, and time. He appears well-developed and well-nourished.  HENT:  Head: Normocephalic and atraumatic.  Mouth/Throat: Oropharynx is clear and moist.  Eyes: Conjunctivae normal and EOM are normal. Pupils are equal, round, and reactive to light.  Neck: Normal range of motion. Neck supple.  Cardiovascular: Normal rate, regular rhythm and normal heart sounds.   No murmur heard. Pulmonary/Chest: Effort normal and breath sounds  normal. No respiratory distress. He has no wheezes. He has no rales. He exhibits no tenderness.  Abdominal: Soft. Bowel sounds are normal. There is no tenderness.  Musculoskeletal: Normal range of motion. He exhibits no edema and no tenderness.  Neurological: He is alert and oriented to person, place, and time. No cranial nerve deficit. He exhibits normal muscle tone. Coordination normal.  Skin: Skin is warm. No rash noted.    ED Course  Procedures (including critical care time)  Labs Reviewed  BASIC METABOLIC PANEL - Abnormal; Notable for the following:    Glucose, Bld 120 (*)     GFR calc non Af Amer 81  (*)     All other components within normal limits  CBC  URINALYSIS, ROUTINE W REFLEX MICROSCOPIC   Dg Chest Port 1 View  02/27/2012  *RADIOLOGY REPORT*  Clinical Data: 71 year old male chest pain shortness of breath numbness dizziness.  PORTABLE CHEST - 1 VIEW  Comparison: 11/15/2010 and earlier.  Findings: AP portable semi upright view 1856 hours.  Stable lung volumes.  Cardiac size and mediastinal contours are within normal limits.  Visualized tracheal air column is within normal limits. No pneumothorax, pulmonary edema, pleural effusion or confluent pulmonary opacity.  IMPRESSION: No acute cardiopulmonary abnormality.   Original Report Authenticated By: [name], M.D.    Results for orders placed during the hospital encounter of 02/27/12  CBC      Component Value Range   WBC 6.2  4.0 - 10.5 K/uL   RBC 4.71  4.22 - 5.81 MIL/uL   Hemoglobin 13.9  13.0 - 17.0 g/dL   HCT 91.4  78.2 - 95.6 %   MCV 86.4  78.0 - 100.0 fL   MCH 29.5  26.0 - 34.0 pg   MCHC 34.2  30.0 - 36.0 g/dL   RDW 21.3  08.6 - 57.8 %   Platelets 169  150 - 400 K/uL  BASIC METABOLIC PANEL      Component Value Range   Sodium 139  135 - 145 mEq/L   Potassium 4.0  3.5 - 5.1 mEq/L   Chloride 103  96 - 112 mEq/L   CO2 26  19 - 32 mEq/L   Glucose, Bld 120 (*) 70 - 99 mg/dL   BUN 19  6 - 23 mg/dL   Creatinine, Ser 4.69  0.50 - 1.35 mg/dL   Calcium 9.7  8.4 - 62.9 mg/dL   GFR calc non Af Amer 81 (*) >90 mL/min   GFR calc Af Amer >90  >90 mL/min     Date: 02/27/2012  Rate: 82  Rhythm: normal sinus rhythm  QRS Axis: indeterminate  Intervals: normal  ST/T Wave abnormalities: nonspecific ST/T changes  Conduction Disutrbances:right bundle branch block  Narrative Interpretation:   Old EKG Reviewed: none available  First troponin is negative at 0.0.  1. Chest pain       MDM   Patient with acute onset of chest pain about one hour prior to me seeing him. He did take a full aspirin at home. Pain when was  improving before giving nitroglycerin the pain went completely away after the nitroglycerin. Patient is currently pain-free. EKG without the any acute changes point-of-care troponin was negative at 0.0. Chest x-rays negative for pneumonia or pneumothorax. Patient's story was very worrisome he does require admission.  Discussed with the [facility] hospitalist they will admit to telemetry also discussed [facility] cardiology fellows patient had been seen by the power one time in the past year  ago for an abnormal EKG consultation that no further workup. All [facility] cardiology fellow will order a cardiac stress test in the morning.  The patient is currently pain-free heparin will not be started.       Shelda Jakes, MD 02/27/12 8119  Shelda Jakes, MD 02/28/12 [phone]

## 2012-02-27 NOTE — ED Notes (Signed)
Pt presents to department for evaluation of midsternal chest pain radiating to L arm. Onset this evening. 6/10 pain at the time. Also states L arm numbness. Respirations unlabored. Lung sounds clear and equal bilaterally. Skin warm and dry. Nothing makes pain worse. No signs of distress noted at the time.

## 2012-02-27 NOTE — Consult Note (Signed)
Primary Cardiologist: Dr. Shirlee Latch Corinda Gubler)  Patient Location: ED Pod A Bed 11  Reason for consultation: chest pain  Primary Team: [facility]  HPI:  [name] is a 71 year old gentleman with a history of hypertension and anxiety who presents with chest pain.   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 Burgess Estelle was a particularly active day for him, as he spent 12 hours working in his wood shop, on his feet for most of the time. He was in his usual state of reasonably good health until earlier today. This morning he awoke at 430 am (his usual time) to prepare breakfast for his family. He then spent several hours in his wood shop. Upon returning to his house for lunch, he experienced central chest pressure which he says was associated with shortness of breath. He describes a distinct pleuritic component to his pain. It radiated to his neck and jaw. No prior history of episodes like this. No painful leg swelling or immobility. No recent surgery or trauma. No recent travel.   Upon arrival to the [facility] ED he was in 5/10 chest pain. He received a single dose of SL NTG, with subsequent complete relief. He is currently chest pain free and without complaints.   Past Medical History  Diagnosis Date  . Other urinary problems   . Chest pain   . Arthritis   . Neuropathy   . Dizziness   . Dyslipidemia   . Anxiety   Hypertension   Past Surgical History  Procedure Date  . Rotator cuff repair   . 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 Shellfish Allergy Anaphylaxis   Medications: Atorvastatin 10mg  Lisinopril 10mg  Celecoxib 200mg  Fish Oil Clonidine 0.1mg  BID  Exam: Afebrile 68 135/78 18 96% RA No acute distress; no accessory muscle use  JVP 7cm H20, no carotid bruits  Chest clear to auscultation bilaterally; no wheezes/crackles  Regular S1 S2 with occasional irregularity; no murmurs appreciated  Abdomen soft non tender non distended  Ext warm & well-perfused, no edema  Neuro A&Ox3  Skin warm & dry  Labs: WBC 6.2K Hgb 13.9 Plts 169K Na 139 K 4.0 Cl 103 CO2 26 BUN 19 Cr 0.9 Glu 120 Troponin POC 0.00 UA negative  ECG: sinus rhythm with RBBB (old) with no ST segment deviation  Assessment/Plan 71 year old gentleman with underlying hypertension and anxiety who presents with a chest pain episode with some atypical features. Thus far, he does not have electrocardiographic or enzymatic evidence for an acute coronary syndrome. I suspect his symptoms were due to a combination of musculoskeletal discomfort and understandable anxiety as a result. I think he warrants admission for enzyme curve completion and risk stratification via a functional test to evaluate for inducible ischemia.   For now, would recommend: - Completion of cardiac biomarker trending overnight - Aspirin 81mg  daily and continuation of his statin therapy - Check lipid profile and HA1C for further risk factor characterization - I do not think he warrants systemic anti-coagulation at this  time. Should his symptoms/ECG/enzymes evolve, this can then be re-considered.  - Please keep NPO after midnight for likely stress testing in the morning.  I conveyed my impressions to the patient and to the primary team. The patient's questions were answered to the best of my ability.   Thank you for involving [place] in his care. Please feel free to contact us with any questions or concerns.  Zacarias Pontes, MD Cardiology  Fellow On-Call [phone]

## 2012-02-28 DIAGNOSIS — R079 Chest pain, unspecified: Secondary | ICD-10-CM

## 2012-02-28 LAB — CBC
Hemoglobin: 13.1 g/dL (ref 13.0–17.0)
Platelets: 151 10*3/uL (ref 150–400)
Platelets: 142 10*3/uL — ABNORMAL LOW (ref 150–400)
RBC: 4.41 MIL/uL (ref 4.22–5.81)
RDW: 13.6 % (ref 11.5–15.5)
WBC: 6.1 10*3/uL (ref 4.0–10.5)
WBC: 5.7 10*3/uL (ref 4.0–10.5)

## 2012-02-28 LAB — BASIC METABOLIC PANEL
Calcium: 9.2 mg/dL (ref 8.4–10.5)
Chloride: 107 mEq/L (ref 96–112)
Creatinine, Ser: 0.91 mg/dL (ref 0.50–1.35)
GFR calc Af Amer: >90 mL/min (ref >90)
GFR calc non Af Amer: 84 mL/min — ABNORMAL LOW (ref >90)

## 2012-02-28 LAB — CREATININE, SERUM
Creatinine, Ser: 0.92 mg/dL (ref 0.50–1.35)
GFR calc non Af Amer: 83 mL/min — ABNORMAL LOW (ref >90)

## 2012-02-28 LAB — TSH: TSH: 1.562 u[IU]/mL (ref 0.350–4.500)

## 2012-02-28 LAB — PROTIME-INR: Prothrombin Time: 13.3 seconds (ref 11.6–15.2)

## 2012-02-28 MED ORDER — ASPIRIN 81 MG PO CHEW: 81.0000 mg | CHEWABLE_TABLET | Freq: Every day | ORAL | Status: DC

## 2012-02-28 MED ORDER — NITROGLYCERIN 0.4 MG SL SUBL: 0.4000 mg | SUBLINGUAL_TABLET | SUBLINGUAL | Status: DC | PRN

## 2012-02-28 NOTE — Discharge Summary (Signed)
Physician Discharge Summary  [name]:[id number] DOB: Oct 15, 1940 DOA: 02/27/2012  PCP: Dalbert Mayotte, MD  Admit date: 02/27/2012 Discharge date: 02/28/2012  Recommendations for Outpatient Follow-up:  1. Stress test scheduled for tomorrow   Discharge Diagnoses:  Principal Problem:  *Chest pain Active Problems:  Hypertension  Dyslipidemia  Neuropathy  Anxiety   Discharge Condition: good  Diet recommendation: heart healthy  Filed Weights   02/27/12 1820 02/27/12 2150  Weight: 95.255 kg (210 lb) 101.9 kg (224 lb 10.4 oz)    History of present illness:  71 yo man admitted for chest pain   Hospital Course:  71 yo with history of atypical chest pain presented with an episode of pleuritic chest pain that began at rest, lasted about 45 minutes. This is similar to his prior episode earlier this year and is atypical. ECG is unchanged and troponins were negative. He needs a stress test. However, he says that he has a very important meeting and will not stay in the hospital for the stress test. Given the atypical nature of the pain, the patient was released and scheduled for stress test the next day at [facility] Cardiology office . He was prescribed aspirin and nitroglycerin.    Procedures:  none  Consultations:  [name] [facility]   Discharge Exam: Filed Vitals:   02/27/12 2045 02/27/12 2150 02/27/12 2320 02/28/12 0530  BP: 124/72 151/79 148/80 122/71  Pulse: 66 65 68 71  Temp:  98.2 F (36.8 C)  97.9 F (36.6 C)  TempSrc:      Resp: 14 18  18   Height:  6' (1.829 m)    Weight:  101.9 kg (224 lb 10.4 oz)    SpO2: 96% 99%  100%    General: axox3 Cardiovascular: rrr Respiratory: ctab  Discharge Instructions  Discharge Orders    Future Appointments: Provider: Department: Dept Phone: Center:   02/29/2012 8:15 AM [facility] [phone] None   03/21/2012 10:45 AM [name], MD [facility] [phone]  [facility]     Future Orders Please Complete By Expires   Diet - low sodium heart healthy      Increase activity slowly          Medication List     As of 02/28/2012 10:26 AM    TAKE these medications         aspirin 81 MG chewable tablet   Chew 1 tablet (81 mg total) by mouth daily.      atorvastatin 20 MG tablet   Commonly known as: LIPITOR   Take 10 mg by mouth daily.      celecoxib 200 MG capsule   Commonly known as: CELEBREX   Take 200 mg by mouth daily.      cloNIDine 0.1 MG tablet   Commonly known as: CATAPRES   Take 0.1 mg by mouth 2 (two) times daily.      fish oil-omega-3 fatty acids 1000 MG capsule   Take 1 g by mouth 2 (two) times daily.      lisinopril 10 MG tablet   Commonly known as: PRINIVIL,ZESTRIL   Take 10 mg by mouth daily.      nitroGLYCERIN 0.4 MG SL tablet   Commonly known as: NITROSTAT   Place 1 tablet (0.4 mg total) under the tongue every 5 (five) minutes x 3 doses as needed for chest pain.      OVER THE COUNTER MEDICATION   Take 2 tablets by mouth 2 (  two) times daily. neuopathy formula vitamin           Follow-up Information    Follow up with [facility]. On 02/29/2012. (8:15 AM - Pharmacologic Stress Test.  Nothing to eat or drink after midnight.  No caffeine today.)    Contact information:   [address] [place] [phone]      Follow up with [name], MD. On 03/21/2012. (10:45 AM)    Contact information:   [address] [place] [address] [phone]           The results of significant diagnostics from this hospitalization (including imaging, microbiology, ancillary and laboratory) are listed below for reference.    Significant Diagnostic Studies: Dg Chest Port 1 View  02/27/2012  *RADIOLOGY REPORT*  Clinical Data: 71 year old male chest pain shortness of breath numbness dizziness.  PORTABLE CHEST - 1 VIEW  Comparison: 11/15/2010 and earlier.  Findings: AP portable semi upright  view 1856 hours.  Stable lung volumes.  Cardiac size and mediastinal contours are within normal limits.  Visualized tracheal air column is within normal limits. No pneumothorax, pulmonary edema, pleural effusion or confluent pulmonary opacity.  IMPRESSION: No acute cardiopulmonary abnormality.   Original Report Authenticated By: [name], M.D.     Microbiology: No results found for this or any previous visit (from the past 240 hour(s)).   Labs: Basic Metabolic Panel:  Lab 02/28/12 6962 02/27/12 2355 02/27/12 1832  NA 141 -- 139  K 4.3 -- 4.0  CL 107 -- 103  CO2 26 -- 26  GLUCOSE 98 -- 120*  BUN 15 -- 19  CREATININE 0.91 0.92 0.98  CALCIUM 9.2 -- 9.7  MG -- -- --  PHOS -- -- --   Liver Function Tests: No results found for this basename: AST:5,ALT:5,ALKPHOS:5,BILITOT:5,PROT:5,ALBUMIN:5 in the last 168 hours No results found for this basename: LIPASE:5,AMYLASE:5 in the last 168 hours No results found for this basename: AMMONIA:5 in the last 168 hours CBC:  Lab 02/28/12 0625 02/27/12 2355 02/27/12 1832  WBC 5.7 6.1 6.2  NEUTROABS -- -- --  HGB 13.5 13.1 13.9  HCT 39.5 38.3* 40.7  MCV 87.2 86.8 86.4  PLT 142* 151 169   Cardiac Enzymes:  Lab 02/28/12 0625 02/27/12 2354  CKTOTAL -- --  CKMB -- --  CKMBINDEX -- --  TROPONINI <0.30 <0.30   BNP: BNP (last 3 results) No results found for this basename: PROBNP:3 in the last 8760 hours CBG: No results found for this basename: GLUCAP:5 in the last 168 hours  Time coordinating discharge: 30 minutes  Signed:  [name]  [facility] 02/28/2012, 10:26 AM

## 2012-02-28 NOTE — Progress Notes (Signed)
Patient ID: [name], male   DOB: July 17, 1940, 71 y.o.   MRN: [id number]    SUBJECTIVE: No further chest pain.  Feels well.      . sodium chloride   Intravenous STAT  . aspirin  81 mg Oral Daily  . atorvastatin  10 mg Oral q1800  . enoxaparin (LOVENOX) injection  40 mg Subcutaneous Q24H  . lisinopril  10 mg Oral Daily  . metoprolol tartrate  25 mg Oral BID  . sodium chloride  250 mL Intravenous Once  . sodium chloride  3 mL Intravenous Q12H      Filed Vitals:   02/27/12 2045 02/27/12 2150 02/27/12 2320 02/28/12 0530  BP: 124/72 151/79 148/80 122/71  Pulse: 66 65 68 71  Temp:  98.2 F (36.8 C)  97.9 F (36.6 C)  TempSrc:      Resp: 14 18  18   Height:  6' (1.829 m)    Weight:  224 lb 10.4 oz (101.9 kg)    SpO2: 96% 99%  100%    Intake/Output Summary (Last 24 hours) at 02/28/12 0814 Last data filed at 02/28/12 0800  Gross per 24 hour  Intake      0 ml  Output      0 ml  Net      0 ml    LABS: Basic Metabolic Panel:  Basename 02/27/12 2355 02/27/12 1832  NA -- 139  K -- 4.0  CL -- 103  CO2 -- 26  GLUCOSE -- 120*  BUN -- 19  CREATININE 0.92 0.98  CALCIUM -- 9.7  MG -- --  PHOS -- --   Liver Function Tests: No results found for this basename: AST:2,ALT:2,ALKPHOS:2,BILITOT:2,PROT:2,ALBUMIN:2 in the last 72 hours No results found for this basename: LIPASE:2,AMYLASE:2 in the last 72 hours CBC:  Basename 02/28/12 0625 02/27/12 2355  WBC 5.7 6.1  NEUTROABS -- --  HGB 13.5 13.1  HCT 39.5 38.3*  MCV 87.2 86.8  PLT 142* 151   Cardiac Enzymes:  Basename 02/27/12 2354  CKTOTAL --  CKMB --  CKMBINDEX --  TROPONINI <0.30   BNP: No components found with this basename: POCBNP:3 D-Dimer: No results found for this basename: DDIMER:2 in the last 72 hours Hemoglobin A1C: No results found for this basename: HGBA1C in the last 72 hours Fasting Lipid Panel: No results found for this basename: CHOL,HDL,LDLCALC,TRIG,CHOLHDL,LDLDIRECT in the last 72  hours Thyroid Function Tests: No results found for this basename: TSH,T4TOTAL,FREET3,T3FREE,THYROIDAB in the last 72 hours Anemia Panel: No results found for this basename: VITAMINB12,FOLATE,FERRITIN,TIBC,IRON,RETICCTPCT in the last 72 hours  RADIOLOGY: Dg Chest Port 1 View  02/27/2012  *RADIOLOGY REPORT*  Clinical Data: 71 year old male chest pain shortness of breath numbness dizziness.  PORTABLE CHEST - 1 VIEW  Comparison: 11/15/2010 and earlier.  Findings: AP portable semi upright view 1856 hours.  Stable lung volumes.  Cardiac size and mediastinal contours are within normal limits.  Visualized tracheal air column is within normal limits. No pneumothorax, pulmonary edema, pleural effusion or confluent pulmonary opacity.  IMPRESSION: No acute cardiopulmonary abnormality.   Original Report Authenticated By: [name], M.D.     PHYSICAL EXAM General: NAD Neck: No JVD, no thyromegaly or thyroid nodule.  Lungs: Clear to auscultation bilaterally with normal respiratory effort. CV: Nondisplaced PMI.  Heart regular S1/S2, no S3/S4, no murmur.  No peripheral edema.  No carotid bruit.  Normal pedal pulses.  Abdomen: Soft, nontender, no hepatosplenomegaly, no distention.  Neurologic: Alert and oriented x 3.  Psych: Normal affect. Extremities: No clubbing or cyanosis.   TELEMETRY: Reviewed telemetry pt in NSR  ASSESSMENT AND PLAN: 71 yo with history of atypical chest pain presented with an episode of pleuritic chest pain that began at rest, lasted about 45 minutes.  This is similar to his prior episode earlier this year and is atypical.  ECG is unchanged and cardiac enzymes so far negative.  He needs a stress test.  However, he says that he has a very important meeting and will not stay in the hospital for the stress test.  Given the atypical nature of the pain, I think this is reasonable.  I will set him up for a stress test at our office tomorrow assuming that his cardiac enzymes this morning  remain negative, and he can go home.   [name] 02/28/2012 8:17 AM

## 2012-02-29 ENCOUNTER — Ambulatory Visit (HOSPITAL_COMMUNITY): Payer: Medicare Other | Attending: Cardiology | Admitting: Radiology

## 2012-02-29 VITALS — BP 130/90 | HR 68 | Ht 72.0 in | Wt 222.0 lb

## 2012-02-29 DIAGNOSIS — R079 Chest pain, unspecified: Secondary | ICD-10-CM

## 2012-02-29 DIAGNOSIS — I4510 Unspecified right bundle-branch block: Secondary | ICD-10-CM | POA: Insufficient documentation

## 2012-02-29 DIAGNOSIS — R42 Dizziness and giddiness: Secondary | ICD-10-CM | POA: Insufficient documentation

## 2012-02-29 DIAGNOSIS — I10 Essential (primary) hypertension: Secondary | ICD-10-CM | POA: Insufficient documentation

## 2012-02-29 DIAGNOSIS — R0789 Other chest pain: Secondary | ICD-10-CM | POA: Insufficient documentation

## 2012-02-29 DIAGNOSIS — Z8249 Family history of ischemic heart disease and other diseases of the circulatory system: Secondary | ICD-10-CM | POA: Insufficient documentation

## 2012-02-29 DIAGNOSIS — R0602 Shortness of breath: Secondary | ICD-10-CM | POA: Insufficient documentation

## 2012-02-29 MED ORDER — REGADENOSON 0.4 MG/5ML IV SOLN
0.4000 mg | Freq: Once | INTRAVENOUS | Status: AC
  Administered 2012-02-29: 0.4 mg via INTRAVENOUS

## 2012-02-29 MED ORDER — TECHNETIUM TC 99M SESTAMIBI GENERIC - CARDIOLITE
10.0000 | Freq: Once | INTRAVENOUS | Status: AC | PRN
  Administered 2012-02-29: 10 via INTRAVENOUS

## 2012-02-29 MED ORDER — TECHNETIUM TC 99M SESTAMIBI GENERIC - CARDIOLITE
30.0000 | Freq: Once | INTRAVENOUS | Status: AC | PRN
  Administered 2012-02-29: 30 via INTRAVENOUS

## 2012-02-29 NOTE — Progress Notes (Addendum)
[facility] SITE 3 NUCLEAR MED [address] [place] [address] [phone]  Cardiology Nuclear Med Study  [name] is a 71 y.o. male     MRN : [id number]     DOB: 16-Sep-1940  Procedure Date: 02/29/2012  Nuclear Med Background Indication for Stress Test:  Evaluation for Ischemia and Post ED Visit on 02/27/12 with Chest Pain, negative enzymes History:  2/13 Echo:EF=60%, mild MR Cardiac Risk Factors: Family History - CAD, History of Smoking, Hypertension, Lipids, Overweight and RBBB  Symptoms:  Chest Pain/Pressure with and without Exertion (last episode of chest discomfort:none since discharge), Dizziness, DOE/SOB and Nausea    Nuclear Pre-Procedure Caffeine/Decaff Intake:  None NPO After: 7:00pm   Lungs:  Clear. O2 Sat: 99% on room air. IV 0.9% NS with Angio Cath:  20g  IV Site: R Hand  IV Started by:  Cathlyn Parsons, RN  Chest Size (in):  44 Cup Size: n/a  Height: 6' (1.829 m)  Weight:  222 lb (100.699 kg)  BMI:  Body mass index is 30.11 kg/(m^2). Tech Comments:  n/a    Nuclear Med Study 1 or 2 day study: 1 day  Stress Test Type:  Lexiscan  Reading MD: Olga Millers, MD  Order Authorizing Provider:  [name], MD  Resting Radionuclide: Technetium 46m Sestamibi  Resting Radionuclide Dose: 11.0 mCi   Stress Radionuclide:  Technetium 79m Sestamibi  Stress Radionuclide Dose: 32.9 mCi           Stress Protocol Rest HR: 68 Stress HR: 88  Rest BP: 130/90 Stress BP: 165/90  Exercise Time (min): n/a METS: n/a   Predicted Max HR: 150 bpm % Max HR: 58.67 bpm Rate Pressure Product: 21308   Dose of Adenosine (mg):  n/a Dose of Lexiscan: 0.4 mg  Dose of Atropine (mg): n/a Dose of Dobutamine: n/a mcg/kg/min (at max HR)  Stress Test Technologist: Smiley Houseman, CMA-N  Nuclear Technologist:  Domenic Polite, CNMT     Rest Procedure:  Myocardial perfusion imaging was performed at rest 45 minutes following the intravenous administration of  Technetium 6m Sestamibi.    Rest ECG: RBBB  Stress Procedure:  The patient received IV Lexiscan 0.4 mg over 15-seconds.  Technetium 73m Sestamibi injected at 30-seconds.  There were no significant changes with Lexiscan, occasional PVC's and PAC's were noted.  Quantitative spect images were obtained after a 45 minute delay.  Stress ECG: No significant ST segment change suggestive of ischemia.  QPS Raw Data Images:  Acquisition technically good; normal left ventricular size. Stress Images:  There is decreased uptake in the inferior wall. Rest Images:  There is decreased uptake in the inferior wall. Subtraction (SDS):  No evidence of ischemia. Transient Ischemic Dilatation (Normal <1.22):  1.00 Lung/Heart Ratio (Normal <0.45):  0.20  Quantitative Gated Spect Images QGS EDV:  104 ml QGS ESV:  38 ml  Impression Exercise Capacity:  Lexiscan with no exercise. BP Response:  Normal blood pressure response. Clinical Symptoms:  No chest pain or dyspnea. ECG Impression:  No significant ST segment change suggestive of ischemia. Comparison with Prior Nuclear Study: No previous nuclear study performed  Overall Impression:  Normal stress nuclear study with a small, mild, fixed inferior defect consistent with thinning; no ischemia.  LV Ejection Fraction: 63%.  LV Wall Motion:  NL LV Function; NL Wall Motion  [name]  No ischemia, low risk.   [name]

## 2012-03-01 LAB — POCT I-STAT TROPONIN I

## 2012-03-02 NOTE — Progress Notes (Signed)
Pt.notified

## 2012-03-21 ENCOUNTER — Encounter: Payer: Medicare Other | Admitting: Cardiology

## 2012-05-02 ENCOUNTER — Encounter: Payer: Medicare Other | Admitting: Cardiology

## 2012-05-15 ENCOUNTER — Ambulatory Visit (INDEPENDENT_AMBULATORY_CARE_PROVIDER_SITE_OTHER): Payer: Medicare Other | Admitting: Cardiology

## 2012-05-15 ENCOUNTER — Encounter: Payer: Self-pay | Admitting: Cardiology

## 2012-05-15 VITALS — BP 156/86 | HR 92 | Ht 72.0 in | Wt 225.0 lb

## 2012-05-15 DIAGNOSIS — R079 Chest pain, unspecified: Secondary | ICD-10-CM

## 2012-05-15 DIAGNOSIS — I10 Essential (primary) hypertension: Secondary | ICD-10-CM

## 2012-05-15 DIAGNOSIS — E785 Hyperlipidemia, unspecified: Secondary | ICD-10-CM

## 2012-05-15 NOTE — Patient Instructions (Addendum)
Take atorvastatin 5mg  daily.  Your physician recommends that you have  a FASTING lipid profile in 2 months--you have the order. Please fax the results to Dr Shirlee Latch [phone].  Your physician wants you to follow-up in: 1 year with Dr Shirlee Latch. (December 2014).You will receive a reminder letter in the mail two months in advance. If you don't receive a letter, please call our office to schedule the follow-up appointment.

## 2012-05-15 NOTE — Progress Notes (Signed)
Patient ID: [name], male   DOB: 07-08-1940, 71 y.o.   MRN: [id number] PCP: Dr. Tawana Scale  71 yo with history of hyperlipidemia and HTN presents for followup.  He was admitted with atypical chest pain in 9/13.  Lexiscan myoview was done, showing EF 63% and no ischemia.  There was a small fixed inferior perfusion defect likely due to attenuation.  Since then, he has been doing well.  BP is high today in the office but has been ranging 125-135 systolic at home.  He is no longer taking lisinopril.  He chops wood and does other heavy activity with no exertional dyspnea.  No further chest pain.  He and his wife have adopted another child from [place].  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    Labs (1/13): K 4.2, creatinine 1.03, LDL 99, HDL 37, HCT 44.4 Labs (2/13): D dimer negative  PMH: 1.  Hyperlipidemia 2. Tourette's syndrome 3. H/o vertigo 4. Diverticulosis 5. Appendectomy 6. HTN 7. DVT 15 yrs ago.  8. Echo (2/13): EF 55-60%, mild MR 9. Chest pain: Lexiscan myoview in 9/13 showed EF 63%, no ischemia, fixed small inferior defect most likely from attenuation.   SH: Married with children.  Lives in [place].  Retired Hotel manager.  Wife is [place], has a house in [place] and travels there once a year.  Quit smoking in 1984.  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    FH: Father with Alzheimers, mother with h/o CAD (PCI at age 27)  Current Outpatient Prescriptions  Medication Sig Dispense Refill  . aspirin 81 MG chewable tablet Chew 1 tablet (81 mg total) by mouth daily.  30 tablet  0  . aspirin 81 MG tablet Take 81 mg by mouth daily.      . celecoxib (CELEBREX) 200 MG capsule Take 200 mg by mouth daily.      . cloNIDine (CATAPRES) 0.1 MG tablet Take 0.1 mg by mouth 2 (two) times daily.       Marland Kitchen atorvastatin (LIPITOR) 10 MG tablet Pt takes 5mg  daily--1/2 of a 10mg  tablet      . fish oil-omega-3 fatty acids 1000 MG capsule Take 1 g by mouth 2 (two) times daily.       Marland Kitchen lisinopril  (PRINIVIL,ZESTRIL) 10 MG tablet Take 10 mg by mouth daily.      . nitroGLYCERIN (NITROSTAT) 0.4 MG SL tablet Place 1 tablet (0.4 mg total) under the tongue every 5 (five) minutes x 3 doses as needed for chest pain.  30 tablet  0  . OVER THE COUNTER MEDICATION Take 2 tablets by mouth 2 (two) times daily. neuopathy formula vitamin       BP 156/86  Pulse 92  Ht 6' (1.829 m)  Wt 225 lb (102.059 kg)  BMI 30.52 kg/m2  SpO2 98% General: NAD Neck: No JVD, no thyromegaly or thyroid nodule.  Lungs: Clear to auscultation bilaterally with normal respiratory effort. CV: Nondisplaced PMI.  Heart regular S1/S2, no S3/S4, no murmur, no friction rub.  No peripheral edema.  No carotid bruit.  Normal pedal pulses.  Abdomen: Soft, nontender, no hepatosplenomegaly, no distention.  Neurologic: Alert and oriented x 3.  Psych: Normal affect. Extremities: No clubbing or cyanosis.   Assessment/Plan: 1. Chest pain: No further chest pain.  Myoview in 9/13 was reassuring.  Continue ASA 81 mg daily.  2. Hyperlipidemia: Taking atorvastatin 5 mg daily.  Will check lipids in 2 months.  3. HTN: BP under good control at home.  He is  only on clonidine now (needs to stay on this med as it controls his tics).    [name] 05/16/2012

## 2012-06-22 ENCOUNTER — Encounter: Payer: Self-pay | Admitting: Cardiology

## 2012-10-13 ENCOUNTER — Telehealth: Payer: Self-pay | Admitting: Cardiology

## 2012-10-13 NOTE — Telephone Encounter (Signed)
Patient states that over past 7-10 days - Dizzy, light headed, SOB, pulse normal but "stronger". Feels faint at times esp upon standing. BP 120/70, HR 74. Denies dehydration. Advised patient to also check BP for orthostatic changes. Patient verified understanding of how to do this type of BP reading. Will bring results and medications into appt on May 13th. Advised patient to call 911/go to ED for worsening symptoms tonight or through the weekend. Educated to assess for worsening BP, headache, bleeding, tingling, dizziness, extremity swelling, SOB, or any changes from current status. Patient verified understanding and agreement with plan.

## 2012-10-17 ENCOUNTER — Ambulatory Visit (INDEPENDENT_AMBULATORY_CARE_PROVIDER_SITE_OTHER): Payer: Medicare Other | Admitting: Cardiology

## 2012-10-17 ENCOUNTER — Encounter: Payer: Self-pay | Admitting: Cardiology

## 2012-10-17 ENCOUNTER — Ambulatory Visit: Payer: Medicare Other | Admitting: Cardiology

## 2012-10-17 VITALS — BP 147/76 | HR 109 | Ht 72.0 in | Wt 225.0 lb

## 2012-10-17 DIAGNOSIS — E785 Hyperlipidemia, unspecified: Secondary | ICD-10-CM

## 2012-10-17 DIAGNOSIS — J309 Allergic rhinitis, unspecified: Secondary | ICD-10-CM

## 2012-10-17 DIAGNOSIS — R079 Chest pain, unspecified: Secondary | ICD-10-CM

## 2012-10-17 DIAGNOSIS — I10 Essential (primary) hypertension: Secondary | ICD-10-CM

## 2012-10-17 MED ORDER — MONTELUKAST SODIUM 10 MG PO TABS: 10.0000 mg | ORAL_TABLET | Freq: Every day | ORAL | Status: DC

## 2012-10-17 MED ORDER — IPRATROPIUM BROMIDE 0.06 % NA SOLN: 2.0000 | Freq: Four times a day (QID) | NASAL | Status: DC

## 2012-10-17 NOTE — Patient Instructions (Addendum)
Your physician has recommended you make the following change in your medication:   STOP CLARITIN D/// TAKE OTC REGULAR CLARITIN START SINGULAR START ATROVENT NS TWICE DAILY  CALL BACK IN 1 WEEK TO UPDATE [place] ON YOUR STATUS [phone] ASK FOR ANNE RN OR USE MYCHART   KEEP F/U FOR DECEMBER

## 2012-10-17 NOTE — Progress Notes (Signed)
Patient ID: [name], male   DOB: 1941-01-22, 72 y.o.   MRN: [id number] PCP: Dr. Tawana Scale  72 yo with history of hyperlipidemia and HTN presents for followup.  He was admitted with atypical chest pain in 9/13.  Lexiscan myoview was done, showing EF 63% and no ischemia.  There was a small fixed inferior perfusion defect likely due to attenuation.  He did well after this until recently.  Over the last couple of weeks, he has developed severe allergic rhinitis symptoms with considerable congestion.  He start Claritin-D.  After starting Claritin-D, he has noted that his heart rate has been higher (in the 90s).  He has also noted dizziness when he stands up.  He does, of note, have a history of prior vertigo and chronic vestibular disturbance with balance difficulty.  He was not orthostatic when we checked in the office today.  No chest pain.    ECG: NSR, RBBB, right superior axis (no change)  Labs (1/13): K 4.2, creatinine 1.03, LDL 99, HDL 37, HCT 44.4 Labs (2/13): D dimer negative Labs (1/14): K 4.4, creatinine 0.88, LDL 85, HDL 32  PMH: 1.  Hyperlipidemia 2. Tourette's syndrome 3. H/o vertigo 4. Diverticulosis 5. Appendectomy 6. HTN 7. DVT 15 yrs ago.  8. Echo (2/13): EF 55-60%, mild MR 9. Chest pain: Lexiscan myoview in 9/13 showed EF 63%, no ischemia, fixed small inferior defect most likely from attenuation.  10. Allergic rhinitis  SH: Married with children.  Lives in [place].  Retired Hotel manager.  Wife is [place], has a house in [place] and travels there once a year.  Quit smoking in 1984.  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    FH: Father with Alzheimers, mother with h/o CAD (PCI at age 14)  ROS: All systems reviewed and negative except as per HPI.   Current Outpatient Prescriptions  Medication Sig Dispense Refill  . aspirin 81 MG chewable tablet Chew 1 tablet (81 mg total) by mouth daily.  30 tablet  0  . aspirin 81 MG tablet Take 81 mg by mouth daily.      Marland Kitchen atorvastatin  (LIPITOR) 10 MG tablet Pt takes 5mg  daily--1/2 of a 10mg  tablet      . celecoxib (CELEBREX) 200 MG capsule Take 200 mg by mouth daily.      . cloNIDine (CATAPRES) 0.1 MG tablet Take 0.1 mg by mouth 2 (two) times daily.       . fish oil-omega-3 fatty acids 1000 MG capsule Take 1 g by mouth 2 (two) times daily.       Marland Kitchen lisinopril (PRINIVIL,ZESTRIL) 10 MG tablet Take 10 mg by mouth daily.      . nitroGLYCERIN (NITROSTAT) 0.4 MG SL tablet Place 1 tablet (0.4 mg total) under the tongue every 5 (five) minutes x 3 doses as needed for chest pain.  30 tablet  0  . OVER THE COUNTER MEDICATION Take 2 tablets by mouth 2 (two) times daily. neuopathy formula vitamin      . ipratropium (ATROVENT) 0.06 % nasal spray Place 2 sprays into the nose 4 (four) times daily.  15 mL  12  . montelukast (SINGULAIR) 10 MG tablet Take 1 tablet (10 mg total) by mouth at bedtime.  90 tablet  3   No current facility-administered medications for this visit.   BP 147/76  Pulse 109  Ht 6' (1.829 m)  Wt 225 lb (102.059 kg)  BMI 30.51 kg/m2 General: NAD Neck: No JVD, no thyromegaly or thyroid nodule.  Lungs:  Clear to auscultation bilaterally with normal respiratory effort. CV: Nondisplaced PMI.  Heart regular S1/S2, no S3/S4, no murmur, no friction rub.  No peripheral edema.  No carotid bruit.  Normal pedal pulses.  Abdomen: Soft, nontender, no hepatosplenomegaly, no distention.  Neurologic: Alert and oriented x 3.  Psych: Normal affect. Extremities: No clubbing or cyanosis.   Assessment/Plan: 1. Chest pain: No further chest pain.  Myoview in 9/13 was reassuring.  Continue ASA 81 mg daily.  2. Hyperlipidemia: Taking atorvastatin 5 mg daily.  Good lipids in 1/14.  3. Congestion/dizziness/relative tachycardia: I think that Mr Salce's underlying cause for his symptoms is allergic rhinitis with sinus congestion.  He has been taking Claritin-D which contains pseudoephedrine.  This is likely the cause of his relative  tachycardia.  The dizziness with standing in the absence of orthostasis by BP/pulse may be vertigo associated with congestion/eustachian tube dysfunction.  - Stop Claritin-D - Start Claritin (without pseudoephedrine).  He says that Claritin alone is not effective for him.  I will therefore give him prescriptions for Singulair 10 mg daily and atrovent nasal spray.  If these steps do not work, he will call back in about a week.   [name] 10/17/2012   [name] 10/17/2012

## 2012-10-27 ENCOUNTER — Encounter: Payer: Self-pay | Admitting: Cardiology

## 2013-01-10 ENCOUNTER — Other Ambulatory Visit: Payer: Self-pay

## 2013-04-12 ENCOUNTER — Other Ambulatory Visit: Payer: Self-pay

## 2014-12-02 ENCOUNTER — Other Ambulatory Visit: Payer: Self-pay

## 2018-07-31 ENCOUNTER — Emergency Department (HOSPITAL_COMMUNITY): Payer: Medicare Other

## 2018-07-31 ENCOUNTER — Encounter (HOSPITAL_COMMUNITY): Payer: Self-pay | Admitting: Emergency Medicine

## 2018-07-31 ENCOUNTER — Other Ambulatory Visit: Payer: Self-pay

## 2018-07-31 ENCOUNTER — Emergency Department (HOSPITAL_COMMUNITY)
Admission: EM | Admit: 2018-07-31 | Discharge: 2018-07-31 | Disposition: A | Discharge status: Home / Self Care | Payer: Medicare Other | Attending: Emergency Medicine | Admitting: Emergency Medicine

## 2018-07-31 DIAGNOSIS — Z87891 Personal history of nicotine dependence: Secondary | ICD-10-CM | POA: Insufficient documentation

## 2018-07-31 DIAGNOSIS — R0789 Other chest pain: Secondary | ICD-10-CM | POA: Insufficient documentation

## 2018-07-31 DIAGNOSIS — Z79899 Other long term (current) drug therapy: Secondary | ICD-10-CM | POA: Insufficient documentation

## 2018-07-31 DIAGNOSIS — Z7982 Long term (current) use of aspirin: Secondary | ICD-10-CM | POA: Diagnosis not present

## 2018-07-31 DIAGNOSIS — I10 Essential (primary) hypertension: Secondary | ICD-10-CM | POA: Diagnosis not present

## 2018-07-31 DIAGNOSIS — R079 Chest pain, unspecified: Secondary | ICD-10-CM | POA: Diagnosis present

## 2018-07-31 LAB — CBC WITH DIFFERENTIAL/PLATELET
BASOS ABS: 0 10*3/uL (ref 0.0–0.1)
BASOS PCT: 1 %
EOS ABS: 0.2 10*3/uL (ref 0.0–0.5)
Eosinophils Relative: 2 %
HCT: 46.9 % (ref 39.0–52.0)
Hemoglobin: 15 g/dL (ref 13.0–17.0)
Lymphocytes Relative: 27 %
Lymphs Abs: 2.1 10*3/uL (ref 0.7–4.0)
MCH: 28.2 pg (ref 26.0–34.0)
MCHC: 32 g/dL (ref 30.0–36.0)
MCV: 88.3 fL (ref 80.0–100.0)
Monocytes Absolute: 0.7 10*3/uL (ref 0.1–1.0)
Monocytes Relative: 9 %
NEUTROS ABS: 4.6 10*3/uL (ref 1.7–7.7)
NEUTROS PCT: 61 %
NRBC: 0 % (ref 0.0–0.2)
PLATELETS: 193 10*3/uL (ref 150–400)
RBC: 5.31 MIL/uL (ref 4.22–5.81)
RDW: 13.9 % (ref 11.5–15.5)
WBC: 7.6 10*3/uL (ref 4.0–10.5)

## 2018-07-31 LAB — URINALYSIS, ROUTINE W REFLEX MICROSCOPIC
Bilirubin Urine: NEGATIVE
Glucose, UA: NEGATIVE mg/dL
HGB URINE DIPSTICK: NEGATIVE
Ketones, ur: NEGATIVE mg/dL
Leukocytes,Ua: NEGATIVE
Nitrite: NEGATIVE
PROTEIN: NEGATIVE mg/dL
Specific Gravity, Urine: 1.016 (ref 1.005–1.030)
pH: 6 (ref 5.0–8.0)

## 2018-07-31 LAB — I-STAT TROPONIN, ED
TROPONIN I, POC: 0 ng/mL (ref 0.00–0.08)
Troponin i, poc: 0 ng/mL (ref 0.00–0.08)

## 2018-07-31 LAB — BASIC METABOLIC PANEL
ANION GAP: 8 (ref 5–15)
BUN: 18 mg/dL (ref 8–23)
CO2: 25 mmol/L (ref 22–32)
Calcium: 9.5 mg/dL (ref 8.9–10.3)
Chloride: 104 mmol/L (ref 98–111)
Creatinine, Ser: 1.05 mg/dL (ref 0.61–1.24)
Glucose, Bld: 97 mg/dL (ref 70–99)
POTASSIUM: 3.9 mmol/L (ref 3.5–5.1)
Sodium: 137 mmol/L (ref 135–145)

## 2018-07-31 NOTE — ED Provider Notes (Signed)
2035: Delta trop unremarkable. Pt discussed with Dr Blinda Leatherwood. Labs, EKG, CXR reviewed and unremarkable. HEART score is 4 based on pmh and age but overall CP sounds atypical. Low suspicion for PE, ACS by previous team. Dc with cardiology f/u. Return precautions given at discharge.    Liberty Handy, PA-C 07/31/18 4801    Gilda Crease, MD 08/01/18 [phone]

## 2018-07-31 NOTE — ED Notes (Signed)
Patient verbalizes understanding of discharge instructions. Opportunity for questioning and answers were provided. Armband removed by staff, pt discharged from ED. Ambulated out to lobby  

## 2018-07-31 NOTE — ED Provider Notes (Addendum)
[facility] Provider Note   CSN: [id number] Arrival date & time: 07/31/18  0458    History   Chief Complaint Chief Complaint  Patient presents with  . Chest Pain    HPI Joanette GulaJames R Torrez is a 78 y.o. male.  HPI: A 78 year old patient with a history of hypertension and hypercholesterolemia presents for evaluation of chest pain. Initial onset of pain was approximately 3-6 hours ago. The patient's chest pain is sharp and is worse with exertion. The patient's chest pain is middle- or left-sided, is not well-localized, is not described as heaviness/pressure/tightness and does not radiate to the arms/jaw/neck. The patient does not complain of nausea and denies diaphoresis. The patient has no history of stroke, has no history of peripheral artery disease, has not smoked in the past 90 days, denies any history of treated diabetes, has no relevant family history of coronary artery disease (first degree relative at less than age 77) and does not have an elevated BMI (>=30).   78 y.o male with a PMH of Dizziness, Anxiety presents to the ED with a chief complaint of chest pain x yesterday. Patient states the pain first began after church on Sunday, he describes it as intermittent sharp pain located in the middle of his chest with radiation to the left side below his breast. He also endorses shortness of breath with this episode as she felt like "I couldn't breath it hurt". Patient reports waking up from his sleep this morning around 2 am with chest pain with the same description and reports taking aspirin and attempting to go back to bed. He states the pain woke him up a second time and he tried taking aspirin and reports relieve in symptoms during arrival in the ED. Patient denies any previous history of heart disease of family history of. According to records on patient's chart his last stress was done 7 years ago with an ECHO which show ~EF:60%. He denies any nausea,  vomiting, fever or recent sickness.      Past Medical History:  Diagnosis Date  . Anxiety   . Arthritis   . Chest pain   . Dizziness   . Dyslipidemia   . Neuropathy   . Other urinary problems     Patient Active Problem List   Diagnosis Date Noted  . Allergic rhinitis 10/17/2012  . Dyslipidemia   . Neuropathy   . Anxiety   . Chest pain 07/14/2011  . Hypertension 07/14/2011    Past Surgical History:  Procedure Laterality Date  . APPENDECTOMY  2012  . ROTATOR CUFF REPAIR          Home Medications    Prior to Admission medications   Medication Sig Start Date End Date Taking? Authorizing Provider  aspirin 81 MG chewable tablet Chew 1 tablet (81 mg total) by mouth daily. Patient taking differently: Chew 162 mg by mouth once.  02/28/12  Yes Laza, Sorin C, MD  cloNIDine (CATAPRES) 0.1 MG tablet Take 0.1 mg by mouth daily as needed (for blood pressure).    Yes [provider]  nitroGLYCERIN (NITROSTAT) 0.4 MG SL tablet Place 1 tablet (0.4 mg total) under the tongue every 5 (five) minutes x 3 doses as needed for chest pain. 02/28/12  Yes Laza, Sorin C, MD  ipratropium (ATROVENT) 0.06 % nasal spray Place 2 sprays into the nose 4 (four) times daily. Patient not taking: Reported on 07/31/2018 10/17/12   Laurey MoraleMcLean, Dalton S, MD  montelukast (SINGULAIR) 10 MG  tablet Take 1 tablet (10 mg total) by mouth at bedtime. Patient not taking: Reported on 07/31/2018 10/17/12   [name], MD    Family History Family History  Problem Relation Age of Onset  . Heart disease Mother     Social History Social History   Tobacco Use  . Smoking status: Former Games developer  . Smokeless tobacco: Never Used  Substance Use Topics  . Alcohol use: No  . Drug use: No     Allergies   Shellfish allergy   Review of Systems Review of Systems  Constitutional: Negative for chills and fever.  HENT: Negative for ear pain and sore throat.   Eyes: Negative for pain and visual disturbance.    Respiratory: Positive for shortness of breath. Negative for cough.   Cardiovascular: Positive for chest pain. Negative for palpitations.  Gastrointestinal: Negative for abdominal pain and vomiting.  Genitourinary: Negative for dysuria and hematuria.  Musculoskeletal: Negative for arthralgias and back pain.  Skin: Negative for color change and rash.  Neurological: Negative for seizures and syncope.  All other systems reviewed and are negative.    Physical Exam Updated Vital Signs BP (!) 142/87   Pulse 80   Temp (!) 97.3 F (36.3 C) (Oral)   Resp 16   Wt 90.7 kg   SpO2 99%   BMI 27.12 kg/m   Physical Exam Vitals signs and nursing note reviewed.  Constitutional:      General: He is not in acute distress.    Appearance: He is well-developed.  HENT:     Head: Normocephalic and atraumatic.  Eyes:     General: No scleral icterus.    Pupils: Pupils are equal, round, and reactive to light.  Neck:     Musculoskeletal: Normal range of motion.  Cardiovascular:     Heart sounds: Murmur present.  Pulmonary:     Effort: Pulmonary effort is normal.     Breath sounds: Normal breath sounds. No decreased breath sounds or wheezing.  Chest:     Chest wall: No tenderness.  Abdominal:     General: Bowel sounds are normal. There is no distension.     Palpations: Abdomen is soft.     Tenderness: There is no abdominal tenderness.  Musculoskeletal:        General: No tenderness or deformity.  Skin:    General: Skin is warm and dry.  Neurological:     Mental Status: He is alert and oriented to person, place, and time.      ED Treatments / Results  Labs (all labs ordered are listed, but only abnormal results are displayed) Labs Reviewed  BASIC METABOLIC PANEL  CBC WITH DIFFERENTIAL/PLATELET  URINALYSIS, ROUTINE W REFLEX MICROSCOPIC  I-STAT TROPONIN, ED    EKG EKG Interpretation  Date/Time:  Monday July 31 2018 05:06:21 EST Ventricular Rate:  88 PR Interval:    QRS  Duration: 142 QT Interval:  394 QTC Calculation: 466 R Axis:   12 Text Interpretation:  Sinus rhythm Atrial premature complex Right bundle branch block No significant change since last tracing Confirmed by Gilda Crease [phone]) on 07/31/2018 5:35:49 AM   Radiology Dg Chest 2 View  Result Date: 07/31/2018 CLINICAL DATA:  78 year old male with chest pain. EXAM: CHEST - 2 VIEW COMPARISON:  Chest radiograph dated 02/27/2012 FINDINGS: There is diffuse interstitial coarsening. No focal consolidation, pleural effusion, or pneumothorax. The cardiac silhouette is within normal limits. No acute osseous pathology. IMPRESSION: No active cardiopulmonary disease. Electronically Signed  By: Elgie Collard M.D.   On: 07/31/2018 06:09    Procedures Procedures (including critical care time)  Medications Ordered in ED Medications - No data to display   Initial Impression / Assessment and Plan / ED Course  I have reviewed the triage vital signs and the nursing notes.  Pertinent labs & imaging results that were available during my care of the patient were reviewed by me and considered in my medical decision making (see chart for details).     HEAR Score: 4  Reports symptoms have resolved after ASA. Patient also states he took antacid which helped with pain as well. First troponin was negative, EKG showed no changes consistent with infarct or STEMI. Patient states his pain is a zero at the moment. Suspicion for ACS vs. Reflux. Patient reports some SOB with this episodes, No tachycardia, no hypoxia. No recent hospitalizations, low suspicion for PE.   Shared decision conversation with patient who reports he works out daily and "the more I think about this pain the more I think I worked myself up". He was informed of his EKG results and chest xray, he voices will rather follow up with cardiology outpatient than stay overnight if not needed.I have discussed the risk and benefits of patient's  admission, he states "I work out every, I will follow up with my cardiologist, did you see my chart I have one".  He was encouraged to stay for a second troponin, patients is in agreement.   Will have patient discharge after second troponin if negative along with rest of laboratory results. Patient care transferred to incoming provider.    Final Clinical Impressions(s) / ED Diagnoses   Final diagnoses:  Atypical chest pain    ED Discharge Orders    None       Claude Manges, PA-C 07/31/18 0711    Claude Manges, PA-C 07/31/18 0719    Gilda Crease, MD 07/31/18 [phone]

## 2018-07-31 NOTE — ED Notes (Signed)
Patient transported to X-ray 

## 2018-07-31 NOTE — Discharge Instructions (Addendum)
Your laboratory results were within normal limits  Please schedule an appointment with Dr. Shirlee Latch your cardiologist for further evaluation of your chest pain episode. If you experience any shortness of breath, chest pain please return to the ED.

## 2018-07-31 NOTE — ED Triage Notes (Signed)
Pt in from home with L side cp/tightness that began yesterday after church. States he took 2 baby ASA, pain resolved, but woke him again this am at 0100. Denies any n/v, diaphoresis or cough, but is sob today. Took another 2 ASA PTA

## 2019-07-09 ENCOUNTER — Encounter (HOSPITAL_COMMUNITY): Payer: Self-pay

## 2019-07-09 ENCOUNTER — Other Ambulatory Visit: Payer: Self-pay

## 2019-07-09 ENCOUNTER — Emergency Department (HOSPITAL_COMMUNITY): Payer: Medicare Other

## 2019-07-09 ENCOUNTER — Inpatient Hospital Stay (HOSPITAL_COMMUNITY)
Admission: EM | Admit: 2019-07-09 | Discharge: 2019-08-06 | DRG: 871 | Disposition: E | Payer: Medicare Other | Attending: Internal Medicine | Admitting: Internal Medicine

## 2019-07-09 DIAGNOSIS — I82443 Acute embolism and thrombosis of tibial vein, bilateral: Secondary | ICD-10-CM | POA: Diagnosis present

## 2019-07-09 DIAGNOSIS — G92 Toxic encephalopathy: Secondary | ICD-10-CM | POA: Diagnosis not present

## 2019-07-09 DIAGNOSIS — Z20822 Contact with and (suspected) exposure to covid-19: Secondary | ICD-10-CM | POA: Diagnosis present

## 2019-07-09 DIAGNOSIS — Z8249 Family history of ischemic heart disease and other diseases of the circulatory system: Secondary | ICD-10-CM

## 2019-07-09 DIAGNOSIS — U071 COVID-19: Secondary | ICD-10-CM

## 2019-07-09 DIAGNOSIS — Z7982 Long term (current) use of aspirin: Secondary | ICD-10-CM

## 2019-07-09 DIAGNOSIS — O2230 Deep phlebothrombosis in pregnancy, unspecified trimester: Secondary | ICD-10-CM

## 2019-07-09 DIAGNOSIS — E039 Hypothyroidism, unspecified: Secondary | ICD-10-CM | POA: Diagnosis present

## 2019-07-09 DIAGNOSIS — Z91013 Allergy to seafood: Secondary | ICD-10-CM

## 2019-07-09 DIAGNOSIS — I10 Essential (primary) hypertension: Secondary | ICD-10-CM | POA: Diagnosis present

## 2019-07-09 DIAGNOSIS — F4310 Post-traumatic stress disorder, unspecified: Secondary | ICD-10-CM | POA: Diagnosis present

## 2019-07-09 DIAGNOSIS — R0602 Shortness of breath: Secondary | ICD-10-CM

## 2019-07-09 DIAGNOSIS — E785 Hyperlipidemia, unspecified: Secondary | ICD-10-CM | POA: Diagnosis present

## 2019-07-09 DIAGNOSIS — J690 Pneumonitis due to inhalation of food and vomit: Secondary | ICD-10-CM | POA: Diagnosis not present

## 2019-07-09 DIAGNOSIS — Z7189 Other specified counseling: Secondary | ICD-10-CM

## 2019-07-09 DIAGNOSIS — G629 Polyneuropathy, unspecified: Secondary | ICD-10-CM | POA: Diagnosis present

## 2019-07-09 DIAGNOSIS — Z515 Encounter for palliative care: Secondary | ICD-10-CM

## 2019-07-09 DIAGNOSIS — R339 Retention of urine, unspecified: Secondary | ICD-10-CM | POA: Diagnosis not present

## 2019-07-09 DIAGNOSIS — I82453 Acute embolism and thrombosis of peroneal vein, bilateral: Secondary | ICD-10-CM | POA: Diagnosis present

## 2019-07-09 DIAGNOSIS — I340 Nonrheumatic mitral (valve) insufficiency: Secondary | ICD-10-CM | POA: Diagnosis present

## 2019-07-09 DIAGNOSIS — I82431 Acute embolism and thrombosis of right popliteal vein: Secondary | ICD-10-CM | POA: Diagnosis present

## 2019-07-09 DIAGNOSIS — A419 Sepsis, unspecified organism: Secondary | ICD-10-CM | POA: Diagnosis present

## 2019-07-09 DIAGNOSIS — Z87891 Personal history of nicotine dependence: Secondary | ICD-10-CM

## 2019-07-09 DIAGNOSIS — A4189 Other specified sepsis: Secondary | ICD-10-CM | POA: Diagnosis not present

## 2019-07-09 DIAGNOSIS — I480 Paroxysmal atrial fibrillation: Secondary | ICD-10-CM | POA: Diagnosis present

## 2019-07-09 DIAGNOSIS — Z66 Do not resuscitate: Secondary | ICD-10-CM | POA: Diagnosis not present

## 2019-07-09 DIAGNOSIS — J1282 Pneumonia due to coronavirus disease 2019: Secondary | ICD-10-CM | POA: Diagnosis present

## 2019-07-09 DIAGNOSIS — J9601 Acute respiratory failure with hypoxia: Secondary | ICD-10-CM | POA: Diagnosis present

## 2019-07-09 DIAGNOSIS — F419 Anxiety disorder, unspecified: Secondary | ICD-10-CM | POA: Diagnosis present

## 2019-07-09 DIAGNOSIS — Z781 Physical restraint status: Secondary | ICD-10-CM

## 2019-07-09 DIAGNOSIS — I82463 Acute embolism and thrombosis of calf muscular vein, bilateral: Secondary | ICD-10-CM | POA: Diagnosis present

## 2019-07-09 DIAGNOSIS — M1990 Unspecified osteoarthritis, unspecified site: Secondary | ICD-10-CM | POA: Diagnosis present

## 2019-07-09 DIAGNOSIS — M7981 Nontraumatic hematoma of soft tissue: Secondary | ICD-10-CM | POA: Diagnosis not present

## 2019-07-09 DIAGNOSIS — I4891 Unspecified atrial fibrillation: Secondary | ICD-10-CM | POA: Diagnosis present

## 2019-07-09 DIAGNOSIS — E873 Alkalosis: Secondary | ICD-10-CM | POA: Diagnosis present

## 2019-07-09 DIAGNOSIS — J189 Pneumonia, unspecified organism: Secondary | ICD-10-CM | POA: Diagnosis present

## 2019-07-09 DIAGNOSIS — Z79899 Other long term (current) drug therapy: Secondary | ICD-10-CM

## 2019-07-09 DIAGNOSIS — J80 Acute respiratory distress syndrome: Secondary | ICD-10-CM | POA: Diagnosis present

## 2019-07-09 LAB — COMPREHENSIVE METABOLIC PANEL
ALT: 102 U/L — ABNORMAL HIGH (ref 0–44)
AST: 117 U/L — ABNORMAL HIGH (ref 15–41)
Albumin: 2.8 g/dL — ABNORMAL LOW (ref 3.5–5.0)
Alkaline Phosphatase: 99 U/L (ref 38–126)
Anion gap: 11 (ref 5–15)
BUN: 35 mg/dL — ABNORMAL HIGH (ref 8–23)
CO2: 21 mmol/L — ABNORMAL LOW (ref 22–32)
Calcium: 7.8 mg/dL — ABNORMAL LOW (ref 8.9–10.3)
Chloride: 104 mmol/L (ref 98–111)
Creatinine, Ser: 1.12 mg/dL (ref 0.61–1.24)
GFR calc Af Amer: >60 mL/min (ref >60)
GFR calc non Af Amer: >60 mL/min (ref >60)
Glucose, Bld: 127 mg/dL — ABNORMAL HIGH (ref 70–99)
Potassium: 5 mmol/L (ref 3.5–5.1)
Sodium: 136 mmol/L (ref 135–145)
Total Bilirubin: 4.7 mg/dL — ABNORMAL HIGH (ref 0.3–1.2)
Total Protein: 7.8 g/dL (ref 6.5–8.1)

## 2019-07-09 LAB — POCT I-STAT 7, (LYTES, BLD GAS, ICA,H+H)
Bicarbonate: 21.4 mmol/L (ref 20.0–28.0)
Calcium, Ion: 1.1 mmol/L — ABNORMAL LOW (ref 1.15–1.40)
HCT: 42 % (ref 39.0–52.0)
Hemoglobin: 14.3 g/dL (ref 13.0–17.0)
O2 Saturation: 98 %
Patient temperature: 97.9
Potassium: 3.2 mmol/L — ABNORMAL LOW (ref 3.5–5.1)
Sodium: 137 mmol/L (ref 135–145)
TCO2: 22 mmol/L (ref 22–32)
pCO2 arterial: 26.2 mmHg — ABNORMAL LOW (ref 32.0–48.0)
pH, Arterial: 7.519 — ABNORMAL HIGH (ref 7.350–7.450)
pO2, Arterial: 85 mmHg (ref 83.0–108.0)

## 2019-07-09 LAB — CBC WITH DIFFERENTIAL/PLATELET
Abs Immature Granulocytes: 0.1 10*3/uL — ABNORMAL HIGH (ref 0.00–0.07)
Basophils Absolute: 0 10*3/uL (ref 0.0–0.1)
Basophils Relative: 0 %
Eosinophils Absolute: 0 10*3/uL (ref 0.0–0.5)
Eosinophils Relative: 0 %
HCT: 46.1 % (ref 39.0–52.0)
Hemoglobin: 15.9 g/dL (ref 13.0–17.0)
Immature Granulocytes: 1 %
Lymphocytes Relative: 4 %
Lymphs Abs: 0.5 10*3/uL — ABNORMAL LOW (ref 0.7–4.0)
MCH: 29.4 pg (ref 26.0–34.0)
MCHC: 34.5 g/dL (ref 30.0–36.0)
MCV: 85.4 fL (ref 80.0–100.0)
Monocytes Absolute: 0.6 10*3/uL (ref 0.1–1.0)
Monocytes Relative: 5 %
Neutro Abs: 10.5 10*3/uL — ABNORMAL HIGH (ref 1.7–7.7)
Neutrophils Relative %: 90 %
Platelets: UNDETERMINED 10*3/uL (ref 150–400)
RBC: 5.4 MIL/uL (ref 4.22–5.81)
RDW: 13.7 % (ref 11.5–15.5)
WBC: 11.7 10*3/uL — ABNORMAL HIGH (ref 4.0–10.5)
nRBC: 0 % (ref 0.0–0.2)

## 2019-07-09 LAB — LACTIC ACID, PLASMA: Lactic Acid, Venous: 2.3 mmol/L (ref 0.5–1.9)

## 2019-07-09 LAB — C-REACTIVE PROTEIN: CRP: 24 mg/dL — ABNORMAL HIGH (ref <1.0)

## 2019-07-09 LAB — POC SARS CORONAVIRUS 2 AG -  ED: SARS Coronavirus 2 Ag: NEGATIVE

## 2019-07-09 LAB — LACTATE DEHYDROGENASE: LDH: 1010 U/L — ABNORMAL HIGH (ref 98–192)

## 2019-07-09 MED ORDER — SODIUM CHLORIDE 0.9 % IV SOLN
2.0000 g | INTRAVENOUS | Status: DC
  Administered 2019-07-10: 2 g via INTRAVENOUS
  Filled 2019-07-09 (×2): qty 20

## 2019-07-09 MED ORDER — ACETAMINOPHEN 500 MG PO TABS
1000.0000 mg | ORAL_TABLET | Freq: Once | ORAL | Status: AC
  Administered 2019-07-10: 1000 mg via ORAL
  Filled 2019-07-09: qty 2

## 2019-07-09 MED ORDER — SODIUM CHLORIDE 0.9 % IV SOLN
500.0000 mg | INTRAVENOUS | Status: DC
  Administered 2019-07-10: 500 mg via INTRAVENOUS
  Filled 2019-07-09: qty 500

## 2019-07-09 MED ORDER — ONDANSETRON HCL 4 MG/2ML IJ SOLN
4.0000 mg | Freq: Once | INTRAMUSCULAR | Status: AC
  Administered 2019-07-09: 4 mg via INTRAVENOUS
  Filled 2019-07-09: qty 2

## 2019-07-09 MED ORDER — SODIUM CHLORIDE 0.9 % IV BOLUS
1000.0000 mL | Freq: Once | INTRAVENOUS | Status: AC
  Administered 2019-07-09: 23:00:00 1000 mL via INTRAVENOUS

## 2019-07-09 NOTE — ED Triage Notes (Addendum)
Pt BIB [facility] from home with SOB. Pt Had been experiencing SOB for 3 weeks approx.. Pt was found 86% O2 on RA, placed on 15L NRB and maintained at 95%+  Pt also complaining of toothache due to infection, per pt.  Pt also complaining of discolored (dark) urine due to low intake.   No Medical Hx but pt is in A-FIB  A&Ox4, normally ambulatory  EMS VS 146/84 BP 148 HR AFIB 38 RR 96 15 NRB  169 CBG

## 2019-07-09 NOTE — ED Provider Notes (Signed)
[facility] EMERGENCY DEPARTMENT Provider Note   CSN: [id number] Arrival date & time: 08/05/2019  2143     History Chief Complaint  Patient presents with  . Shortness of Breath    [name] is a 79 y.o. male.  HPI      [name] is a 79 y.o. male, with a history of anxiety and dyslipidemia, presenting to the ED with shortness of breath.  He states, "I have been trying to call 911 for it for the last 4 days.  When I would call from my hotel room, I would get a 911 service in [place]." He also states he has had difficulty breathing and generalized weakness for the last 4 weeks since he underwent oral surgery.  His mouth pain and swelling has caused him to have decreased oral intake.  Denies fever/chills, chest pain, cough, vomiting, diarrhea, abdominal pain, urinary symptoms, syncope, or any other complaints.     Past Medical History:  Diagnosis Date  . Anxiety   . Arthritis   . Chest pain   . Dizziness   . Dyslipidemia   . Neuropathy   . Other urinary problems     Patient Active Problem List   Diagnosis Date Noted  . Multifocal pneumonia 07/10/2019  . Suspected COVID-19 virus infection 07/10/2019  . Sepsis (May) 07/10/2019  . Allergic rhinitis 10/17/2012  . Dyslipidemia   . Neuropathy   . Anxiety   . Chest pain 07/14/2011  . Hypertension 07/14/2011    Past Surgical History:  Procedure Laterality Date  . APPENDECTOMY  2012  . ROTATOR CUFF REPAIR         Family History  Problem Relation Age of Onset  . Heart disease Mother     Social History   Tobacco Use  . Smoking status: Former Research scientist (life sciences)  . Smokeless tobacco: Never Used  Substance Use Topics  . Alcohol use: No  . Drug use: No    Home Medications Prior to Admission medications   Medication Sig Start Date End Date Taking? Authorizing Provider  aspirin 81 MG chewable tablet Chew 1 tablet (81 mg total) by mouth daily. Patient not taking: Reported on 07/23/2019 02/28/12    Elvera Lennox, MD  cloNIDine (CATAPRES) 0.1 MG tablet Take 0.1 mg by mouth daily as needed (for blood pressure).     [provider]  ipratropium (ATROVENT) 0.06 % nasal spray Place 2 sprays into the nose 4 (four) times daily. Patient not taking: Reported on 07/31/2018 10/17/12   Larey Dresser, MD  montelukast (SINGULAIR) 10 MG tablet Take 1 tablet (10 mg total) by mouth at bedtime. Patient not taking: Reported on 07/31/2018 10/17/12   Larey Dresser, MD  nitroGLYCERIN (NITROSTAT) 0.4 MG SL tablet Place 1 tablet (0.4 mg total) under the tongue every 5 (five) minutes x 3 doses as needed for chest pain. 02/28/12   Elvera Lennox, MD    Allergies    Shellfish allergy  Review of Systems   Review of Systems  Constitutional: Positive for appetite change and fatigue. Negative for diaphoresis and fever.  HENT: Positive for sore throat. Negative for trouble swallowing and voice change.   Respiratory: Positive for shortness of breath. Negative for cough.   Cardiovascular: Negative for chest pain and leg swelling.  Gastrointestinal: Negative for abdominal pain, blood in stool, constipation, diarrhea, nausea and vomiting.  Neurological: Positive for weakness. Negative for syncope.  All other systems reviewed and are negative.   Physical  Exam Updated Vital Signs BP (!) 113/54   Pulse (!) 141   Temp 97.9 F (36.6 C) (Oral)   Resp (!) 26   Ht 6\' 1"  (1.854 m)   Wt 112.5 kg   SpO2 (!) 85%   BMI 32.72 kg/m   Physical Exam Vitals and nursing note reviewed.  Constitutional:      General: He is in acute distress.     Appearance: He is well-developed. He is ill-appearing. He is not diaphoretic.  HENT:     Head: Normocephalic and atraumatic.     Mouth/Throat:     Mouth: Mucous membranes are dry.     Pharynx: Oropharynx is clear.     Comments: Dentition appears to be intact and stable.  No noted area of swelling or fluctuance.  No trismus or noted abnormal phonation.  Handles oral  secretions without difficulty.  No noted facial swelling.  No sublingual swelling.   Some tenderness to the submental and submandibular regions. Eyes:     Conjunctiva/sclera: Conjunctivae normal.  Cardiovascular:     Rate and Rhythm: Regular rhythm. Tachycardia present.     Pulses: Normal pulses.          Radial pulses are 2+ on the right side and 2+ on the left side.       Posterior tibial pulses are 2+ on the right side and 2+ on the left side.     Heart sounds: Normal heart sounds.     Comments: Tactile temperature in the extremities appropriate and equal bilaterally. Pulmonary:     Effort: Tachypnea, accessory muscle usage and respiratory distress present.     Comments: Speaks in short sentences.  SPO2 down to 82% on 4 L nasal cannula.  Able to maintain at least 96% on nonrebreather 15 L. Abdominal:     Palpations: Abdomen is soft.     Tenderness: There is no abdominal tenderness. There is no guarding.  Musculoskeletal:     Cervical back: Neck supple.     Right lower leg: No edema.     Left lower leg: No edema.  Lymphadenopathy:     Cervical: No cervical adenopathy.  Skin:    General: Skin is warm and dry.  Neurological:     Mental Status: He is alert.  Psychiatric:        Mood and Affect: Mood and affect normal.        Speech: Speech normal.        Behavior: Behavior normal.     ED Results / Procedures / Treatments   Labs (all labs ordered are listed, but only abnormal results are displayed) Labs Reviewed  COMPREHENSIVE METABOLIC PANEL - Abnormal; Notable for the following components:      Result Value   CO2 21 (*)    Glucose, Bld 127 (*)    BUN 35 (*)    Calcium 7.8 (*)    Albumin 2.8 (*)    AST 117 (*)    ALT 102 (*)    Total Bilirubin 4.7 (*)    All other components within normal limits  LACTIC ACID, PLASMA - Abnormal; Notable for the following components:   Lactic Acid, Venous 2.3 (*)    All other components within normal limits  CBC WITH  DIFFERENTIAL/PLATELET - Abnormal; Notable for the following components:   WBC 11.7 (*)    Neutro Abs 10.5 (*)    Lymphs Abs 0.5 (*)    Abs Immature Granulocytes 0.10 (*)    All other  components within normal limits  LACTATE DEHYDROGENASE - Abnormal; Notable for the following components:   LDH 1,010 (*)    All other components within normal limits  C-REACTIVE PROTEIN - Abnormal; Notable for the following components:   CRP 24.0 (*)    All other components within normal limits  POCT I-STAT 7, (LYTES, BLD GAS, ICA,H+H) - Abnormal; Notable for the following components:   pH, Arterial 7.519 (*)    pCO2 arterial 26.2 (*)    Potassium 3.2 (*)    Calcium, Ion 1.10 (*)    All other components within normal limits  CULTURE, BLOOD (ROUTINE X 2)  CULTURE, BLOOD (ROUTINE X 2)  RESPIRATORY PANEL BY RT PCR (FLU A&B, COVID)  URINE CULTURE  LACTIC ACID, PLASMA  URINALYSIS, ROUTINE W REFLEX MICROSCOPIC  BLOOD GAS, ARTERIAL  TRIGLYCERIDES  APTT  PROTIME-INR  D-DIMER, QUANTITATIVE (NOT AT [facility]  FERRITIN  FIBRINOGEN  LACTATE DEHYDROGENASE  PROCALCITONIN  POC SARS CORONAVIRUS 2 AG -  ED    EKG EKG Interpretation  Date/Time:  Monday July 09 2019 22:00:33 EST Ventricular Rate:  144 PR Interval:    QRS Duration: 130 QT Interval:  383 QTC Calculation: 593 R Axis:   -78 Text Interpretation: Wide-QRS tachycardia RBBB and LAFB Confirmed by Vanetta Mulders [phone]) on 07/27/2019 10:05:17 PM   Radiology DG Chest Port 1 View  Result Date: 07/19/2019 CLINICAL DATA:  Shortness of breath. EXAM: PORTABLE CHEST 1 VIEW COMPARISON:  July 31, 2018 FINDINGS: Moderate to marked severity infiltrates are seen within the bilateral lung bases and along the periphery of the mid to upper right lung. There is no evidence of a pleural effusion or pneumothorax. The heart size and mediastinal contours are within normal limits. Multilevel degenerative changes seen throughout the thoracic spine. IMPRESSION: 1.  Moderate to marked severity bilateral infiltrates, right greater than left. Electronically Signed   By: Aram Candela M.D.   On: 08/04/2019 23:10    Procedures .Critical Care Performed by: Anselm Pancoast, PA-C Authorized by: Anselm Pancoast, PA-C   Critical care provider statement:    Critical care time (minutes):  35   Critical care time was exclusive of:  Separately billable procedures and treating other patients   Critical care was necessary to treat or prevent imminent or life-threatening deterioration of the following conditions:  Respiratory failure   Critical care was time spent personally by me on the following activities:  Development of treatment plan with patient or surrogate, discussions with consultants, evaluation of patient's response to treatment, examination of patient, obtaining history from patient or surrogate, ordering and performing treatments and interventions, ordering and review of laboratory studies, ordering and review of radiographic studies, pulse oximetry and re-evaluation of patient's condition   I assumed direction of critical care for this patient from another provider in my specialty: no     (including critical care time)  Medications Ordered in ED Medications  cefTRIAXone (ROCEPHIN) 2 g in sodium chloride 0.9 % 100 mL IVPB (2 g Intravenous New Bag/Given 07/10/19 0016)  azithromycin (ZITHROMAX) 500 mg in sodium chloride 0.9 % 250 mL IVPB (has no administration in time range)  sodium chloride 0.9 % bolus 1,000 mL (has no administration in time range)  dexamethasone (DECADRON) injection 6 mg (has no administration in time range)  sodium chloride 0.9 % bolus 1,000 mL (1,000 mLs Intravenous New Bag/Given 07/16/2019 2241)  ondansetron (ZOFRAN) injection 4 mg (4 mg Intravenous Given 07/19/2019 2242)  acetaminophen (TYLENOL) tablet 1,000 mg (1,000 mg  Oral Given 07/10/19 0014)    ED Course  I have reviewed the triage vital signs and the nursing notes.  Pertinent labs &  imaging results that were available during my care of the patient were reviewed by me and considered in my medical decision making (see chart for details).  Clinical Course as of Jul 09 32  Mon Jul 09, 2019  2356 Spoke with Dr. Julian Reil, hospitalist.  Agrees to admit the patient.  He will review pending lab work and place necessary orders for any additional imaging.   [SJ]    Clinical Course User Index [SJ] Kollin Udell, Hillard Danker, PA-C   MDM Rules/Calculators/A&P                      Patient presents with shortness of breath.  Patient was quite ill-appearing at presentation.  Tachycardic, tachypneic, and hypoxic, requiring high flow O2 to maintain adequate SPO2.  Later noted to be febrile. COVID-19 infection is high on the differential.  POC Covid test negative.  Code sepsis activated at that time.  Bacterial infection on top of Covid infection is also a consideration.  PE is a possibility, however, we thought patient should have more stabilization, if possible, prior to being transported to the CT scanner. Patient admitted via the hospitalist.  Additional, helpful lab work pending at time of admission.  Findings and plan of care discussed with Vanetta Mulders, MD. Dr. Deretha Emory personally evaluated and examined this patient.  Vitals:   07/10/2019 2315 07/18/2019 2345 07/10/19 0000 07/10/19 0015  BP: 133/75 121/86 116/90 103/89  Pulse:  (!) 136 (!) 181 (!) 142  Resp: 11 (!) 30 (!) 33 (!) 26  Temp:      TempSrc:      SpO2:  97% 100% 96%  Weight:      Height:         Final Clinical Impression(s) / ED Diagnoses Final diagnoses:  Acute respiratory failure with hypoxia (HCC)  SOB (shortness of breath)    Rx / DC Orders ED Discharge Orders    None       Concepcion Living 07/10/19 0034    Vanetta Mulders, MD 07/19/19 [phone]

## 2019-07-10 ENCOUNTER — Inpatient Hospital Stay (HOSPITAL_COMMUNITY): Payer: Medicare Other

## 2019-07-10 ENCOUNTER — Other Ambulatory Visit: Payer: Self-pay

## 2019-07-10 DIAGNOSIS — J80 Acute respiratory distress syndrome: Secondary | ICD-10-CM | POA: Diagnosis present

## 2019-07-10 DIAGNOSIS — G92 Toxic encephalopathy: Secondary | ICD-10-CM | POA: Diagnosis not present

## 2019-07-10 DIAGNOSIS — I4891 Unspecified atrial fibrillation: Secondary | ICD-10-CM | POA: Diagnosis present

## 2019-07-10 DIAGNOSIS — U071 COVID-19: Secondary | ICD-10-CM | POA: Diagnosis present

## 2019-07-10 DIAGNOSIS — Z20822 Contact with and (suspected) exposure to covid-19: Secondary | ICD-10-CM | POA: Diagnosis present

## 2019-07-10 DIAGNOSIS — J189 Pneumonia, unspecified organism: Secondary | ICD-10-CM

## 2019-07-10 DIAGNOSIS — J9601 Acute respiratory failure with hypoxia: Secondary | ICD-10-CM | POA: Diagnosis present

## 2019-07-10 DIAGNOSIS — Z87891 Personal history of nicotine dependence: Secondary | ICD-10-CM | POA: Diagnosis not present

## 2019-07-10 DIAGNOSIS — Z781 Physical restraint status: Secondary | ICD-10-CM | POA: Diagnosis not present

## 2019-07-10 DIAGNOSIS — E039 Hypothyroidism, unspecified: Secondary | ICD-10-CM | POA: Diagnosis present

## 2019-07-10 DIAGNOSIS — E785 Hyperlipidemia, unspecified: Secondary | ICD-10-CM | POA: Diagnosis present

## 2019-07-10 DIAGNOSIS — I82431 Acute embolism and thrombosis of right popliteal vein: Secondary | ICD-10-CM | POA: Diagnosis present

## 2019-07-10 DIAGNOSIS — E873 Alkalosis: Secondary | ICD-10-CM | POA: Diagnosis present

## 2019-07-10 DIAGNOSIS — F4310 Post-traumatic stress disorder, unspecified: Secondary | ICD-10-CM | POA: Diagnosis present

## 2019-07-10 DIAGNOSIS — I82453 Acute embolism and thrombosis of peroneal vein, bilateral: Secondary | ICD-10-CM | POA: Diagnosis present

## 2019-07-10 DIAGNOSIS — I340 Nonrheumatic mitral (valve) insufficiency: Secondary | ICD-10-CM | POA: Diagnosis present

## 2019-07-10 DIAGNOSIS — I480 Paroxysmal atrial fibrillation: Secondary | ICD-10-CM | POA: Diagnosis present

## 2019-07-10 DIAGNOSIS — Z66 Do not resuscitate: Secondary | ICD-10-CM | POA: Diagnosis not present

## 2019-07-10 DIAGNOSIS — J690 Pneumonitis due to inhalation of food and vomit: Secondary | ICD-10-CM | POA: Diagnosis not present

## 2019-07-10 DIAGNOSIS — A4189 Other specified sepsis: Secondary | ICD-10-CM | POA: Diagnosis present

## 2019-07-10 DIAGNOSIS — I82463 Acute embolism and thrombosis of calf muscular vein, bilateral: Secondary | ICD-10-CM | POA: Diagnosis present

## 2019-07-10 DIAGNOSIS — I82443 Acute embolism and thrombosis of tibial vein, bilateral: Secondary | ICD-10-CM | POA: Diagnosis present

## 2019-07-10 DIAGNOSIS — Z7189 Other specified counseling: Secondary | ICD-10-CM | POA: Diagnosis not present

## 2019-07-10 DIAGNOSIS — R7989 Other specified abnormal findings of blood chemistry: Secondary | ICD-10-CM | POA: Diagnosis not present

## 2019-07-10 DIAGNOSIS — M7981 Nontraumatic hematoma of soft tissue: Secondary | ICD-10-CM | POA: Diagnosis not present

## 2019-07-10 DIAGNOSIS — R339 Retention of urine, unspecified: Secondary | ICD-10-CM | POA: Diagnosis not present

## 2019-07-10 DIAGNOSIS — R0602 Shortness of breath: Secondary | ICD-10-CM | POA: Diagnosis present

## 2019-07-10 DIAGNOSIS — F419 Anxiety disorder, unspecified: Secondary | ICD-10-CM | POA: Diagnosis present

## 2019-07-10 DIAGNOSIS — R6520 Severe sepsis without septic shock: Secondary | ICD-10-CM

## 2019-07-10 DIAGNOSIS — A419 Sepsis, unspecified organism: Secondary | ICD-10-CM | POA: Diagnosis present

## 2019-07-10 DIAGNOSIS — J1282 Pneumonia due to coronavirus disease 2019: Secondary | ICD-10-CM | POA: Diagnosis present

## 2019-07-10 DIAGNOSIS — Z515 Encounter for palliative care: Secondary | ICD-10-CM | POA: Diagnosis not present

## 2019-07-10 DIAGNOSIS — I10 Essential (primary) hypertension: Secondary | ICD-10-CM | POA: Diagnosis present

## 2019-07-10 LAB — COMPREHENSIVE METABOLIC PANEL
ALT: 72 U/L — ABNORMAL HIGH (ref 0–44)
AST: 62 U/L — ABNORMAL HIGH (ref 15–41)
Albumin: 2.4 g/dL — ABNORMAL LOW (ref 3.5–5.0)
Alkaline Phosphatase: 88 U/L (ref 38–126)
Anion gap: 13 (ref 5–15)
BUN: 32 mg/dL — ABNORMAL HIGH (ref 8–23)
CO2: 21 mmol/L — ABNORMAL LOW (ref 22–32)
Calcium: 7.7 mg/dL — ABNORMAL LOW (ref 8.9–10.3)
Chloride: 107 mmol/L (ref 98–111)
Creatinine, Ser: 1 mg/dL (ref 0.61–1.24)
GFR calc Af Amer: >60 mL/min (ref >60)
GFR calc non Af Amer: >60 mL/min (ref >60)
Glucose, Bld: 183 mg/dL — ABNORMAL HIGH (ref 70–99)
Potassium: 3.3 mmol/L — ABNORMAL LOW (ref 3.5–5.1)
Sodium: 141 mmol/L (ref 135–145)
Total Bilirubin: 1.2 mg/dL (ref 0.3–1.2)
Total Protein: 6.8 g/dL (ref 6.5–8.1)

## 2019-07-10 LAB — LACTATE DEHYDROGENASE: LDH: 530 U/L — ABNORMAL HIGH (ref 98–192)

## 2019-07-10 LAB — URINE CULTURE: Culture: NO GROWTH

## 2019-07-10 LAB — CBC WITH DIFFERENTIAL/PLATELET
Abs Immature Granulocytes: 0.1 10*3/uL — ABNORMAL HIGH (ref 0.00–0.07)
Basophils Absolute: 0 10*3/uL (ref 0.0–0.1)
Basophils Relative: 0 %
Eosinophils Absolute: 0 10*3/uL (ref 0.0–0.5)
Eosinophils Relative: 0 %
HCT: 41.4 % (ref 39.0–52.0)
Hemoglobin: 13.8 g/dL (ref 13.0–17.0)
Immature Granulocytes: 1 %
Lymphocytes Relative: 4 %
Lymphs Abs: 0.4 10*3/uL — ABNORMAL LOW (ref 0.7–4.0)
MCH: 28.9 pg (ref 26.0–34.0)
MCHC: 33.3 g/dL (ref 30.0–36.0)
MCV: 86.6 fL (ref 80.0–100.0)
Monocytes Absolute: 0.2 10*3/uL (ref 0.1–1.0)
Monocytes Relative: 2 %
Neutro Abs: 9.1 10*3/uL — ABNORMAL HIGH (ref 1.7–7.7)
Neutrophils Relative %: 93 %
Platelets: 135 10*3/uL — ABNORMAL LOW (ref 150–400)
RBC: 4.78 MIL/uL (ref 4.22–5.81)
RDW: 13.7 % (ref 11.5–15.5)
WBC: 9.9 10*3/uL (ref 4.0–10.5)
nRBC: 0 % (ref 0.0–0.2)

## 2019-07-10 LAB — URINALYSIS, ROUTINE W REFLEX MICROSCOPIC
Bilirubin Urine: NEGATIVE
Glucose, UA: NEGATIVE mg/dL
Ketones, ur: 20 mg/dL — AB
Leukocytes,Ua: NEGATIVE
Nitrite: NEGATIVE
Protein, ur: 100 mg/dL — AB
Specific Gravity, Urine: 1.026 (ref 1.005–1.030)
pH: 6 (ref 5.0–8.0)

## 2019-07-10 LAB — RESPIRATORY PANEL BY RT PCR (FLU A&B, COVID)
Influenza A by PCR: NEGATIVE
Influenza B by PCR: NEGATIVE
SARS Coronavirus 2 by RT PCR: POSITIVE — AB

## 2019-07-10 LAB — PROTIME-INR
INR: 1.6 — ABNORMAL HIGH (ref 0.8–1.2)
Prothrombin Time: 18.6 seconds — ABNORMAL HIGH (ref 11.4–15.2)

## 2019-07-10 LAB — MRSA PCR SCREENING: MRSA by PCR: NEGATIVE

## 2019-07-10 LAB — FIBRINOGEN: Fibrinogen: 238 mg/dL (ref 210–475)

## 2019-07-10 LAB — MAGNESIUM: Magnesium: 2.7 mg/dL — ABNORMAL HIGH (ref 1.7–2.4)

## 2019-07-10 LAB — PROCALCITONIN: Procalcitonin: 0.24 ng/mL

## 2019-07-10 LAB — APTT: aPTT: 30 seconds (ref 24–36)

## 2019-07-10 LAB — FERRITIN
Ferritin: 746 ng/mL — ABNORMAL HIGH (ref 24–336)
Ferritin: 806 ng/mL — ABNORMAL HIGH (ref 24–336)

## 2019-07-10 LAB — TRIGLYCERIDES: Triglycerides: 119 mg/dL (ref <150)

## 2019-07-10 LAB — D-DIMER, QUANTITATIVE
D-Dimer, Quant: >20 ug/mL-FEU — ABNORMAL HIGH (ref 0.00–0.50)
D-Dimer, Quant: >20 ug/mL-FEU — ABNORMAL HIGH (ref 0.00–0.50)

## 2019-07-10 LAB — HEPARIN LEVEL (UNFRACTIONATED): Heparin Unfractionated: 0.26 IU/mL — ABNORMAL LOW (ref 0.30–0.70)

## 2019-07-10 LAB — LACTIC ACID, PLASMA: Lactic Acid, Venous: 2.1 mmol/L (ref 0.5–1.9)

## 2019-07-10 LAB — C-REACTIVE PROTEIN: CRP: 21.1 mg/dL — ABNORMAL HIGH (ref <1.0)

## 2019-07-10 MED ORDER — ONDANSETRON HCL 4 MG PO TABS: 4.0000 mg | ORAL_TABLET | Freq: Four times a day (QID) | ORAL | Status: DC | PRN

## 2019-07-10 MED ORDER — ONDANSETRON HCL 4 MG/2ML IJ SOLN: 4.0000 mg | Freq: Four times a day (QID) | INTRAMUSCULAR | Status: DC | PRN

## 2019-07-10 MED ORDER — IOHEXOL 350 MG/ML SOLN
100.0000 mL | Freq: Once | INTRAVENOUS | Status: AC | PRN
  Administered 2019-07-10: 11:00:00 70 mL via INTRAVENOUS

## 2019-07-10 MED ORDER — ACETAMINOPHEN 325 MG PO TABS
650.0000 mg | ORAL_TABLET | Freq: Four times a day (QID) | ORAL | Status: DC | PRN
  Administered 2019-07-11 – 2019-07-14 (×5): 650 mg via ORAL
  Filled 2019-07-10 (×5): qty 2

## 2019-07-10 MED ORDER — HEPARIN BOLUS VIA INFUSION
6000.0000 [IU] | Freq: Once | INTRAVENOUS | Status: AC
  Administered 2019-07-10: 02:00:00 6000 [IU] via INTRAVENOUS
  Filled 2019-07-10: qty 6000

## 2019-07-10 MED ORDER — SODIUM CHLORIDE 0.9 % IV SOLN
100.0000 mg | Freq: Every day | INTRAVENOUS | Status: AC
  Administered 2019-07-11 – 2019-07-14 (×4): 100 mg via INTRAVENOUS
  Filled 2019-07-10 (×4): qty 20

## 2019-07-10 MED ORDER — METOPROLOL TARTRATE 12.5 MG HALF TABLET
12.5000 mg | ORAL_TABLET | Freq: Two times a day (BID) | ORAL | Status: DC
  Administered 2019-07-10: 12.5 mg via ORAL
  Filled 2019-07-10: qty 1

## 2019-07-10 MED ORDER — ENOXAPARIN SODIUM 40 MG/0.4ML ~~LOC~~ SOLN: 40.0000 mg | Freq: Every day | SUBCUTANEOUS | Status: DC

## 2019-07-10 MED ORDER — WHITE PETROLATUM EX OINT
TOPICAL_OINTMENT | CUTANEOUS | Status: DC | PRN
  Administered 2019-07-10 – 2019-07-16 (×3): 0.2 via TOPICAL
  Filled 2019-07-10: qty 28.35

## 2019-07-10 MED ORDER — METOPROLOL TARTRATE 25 MG PO TABS
25.0000 mg | ORAL_TABLET | Freq: Two times a day (BID) | ORAL | Status: DC
  Administered 2019-07-10 – 2019-07-11 (×2): 25 mg via ORAL
  Filled 2019-07-10 (×2): qty 1

## 2019-07-10 MED ORDER — SODIUM CHLORIDE 0.9 % IV SOLN
200.0000 mg | Freq: Once | INTRAVENOUS | Status: AC
  Administered 2019-07-10: 200 mg via INTRAVENOUS
  Filled 2019-07-10: qty 200

## 2019-07-10 MED ORDER — LIP MEDEX EX OINT
TOPICAL_OINTMENT | CUTANEOUS | Status: DC | PRN
  Filled 2019-07-10: qty 7

## 2019-07-10 MED ORDER — TOCILIZUMAB 400 MG/20ML IV SOLN
800.0000 mg | Freq: Once | INTRAVENOUS | Status: AC
  Administered 2019-07-10: 800 mg via INTRAVENOUS
  Filled 2019-07-10: qty 40

## 2019-07-10 MED ORDER — SALINE SPRAY 0.65 % NA SOLN
1.0000 | NASAL | Status: DC | PRN
  Administered 2019-07-10 – 2019-07-17 (×2): 1 via NASAL
  Filled 2019-07-10: qty 44

## 2019-07-10 MED ORDER — PHENOL 1.4 % MT LIQD
1.0000 | OROMUCOSAL | Status: DC | PRN
  Administered 2019-07-10: 13:00:00 1 via OROMUCOSAL
  Filled 2019-07-10: qty 177

## 2019-07-10 MED ORDER — ENSURE ENLIVE PO LIQD
237.0000 mL | Freq: Two times a day (BID) | ORAL | Status: DC
  Administered 2019-07-10 – 2019-07-14 (×8): 237 mL via ORAL

## 2019-07-10 MED ORDER — ORAL CARE MOUTH RINSE
15.0000 mL | Freq: Two times a day (BID) | OROMUCOSAL | Status: DC
  Administered 2019-07-10 – 2019-07-17 (×13): 15 mL via OROMUCOSAL

## 2019-07-10 MED ORDER — HEPARIN (PORCINE) 25000 UT/250ML-% IV SOLN
1550.0000 [IU]/h | INTRAVENOUS | Status: DC
  Administered 2019-07-10: 02:00:00 1400 [IU]/h via INTRAVENOUS
  Filled 2019-07-10: qty 250

## 2019-07-10 MED ORDER — SODIUM CHLORIDE 0.9 % IV BOLUS
1000.0000 mL | Freq: Once | INTRAVENOUS | Status: AC
  Administered 2019-07-10: 01:00:00 1000 mL via INTRAVENOUS

## 2019-07-10 MED ORDER — APIXABAN 5 MG PO TABS
5.0000 mg | ORAL_TABLET | Freq: Two times a day (BID) | ORAL | Status: DC
  Administered 2019-07-10 – 2019-07-11 (×3): 5 mg via ORAL
  Filled 2019-07-10 (×3): qty 1

## 2019-07-10 MED ORDER — DILTIAZEM HCL-DEXTROSE 125-5 MG/125ML-% IV SOLN (PREMIX)
5.0000 mg/h | INTRAVENOUS | Status: DC
  Administered 2019-07-10 (×2): 15 mg/h via INTRAVENOUS
  Administered 2019-07-10: 01:00:00 5 mg/h via INTRAVENOUS
  Administered 2019-07-11 (×2): 15 mg/h via INTRAVENOUS
  Filled 2019-07-10 (×6): qty 125

## 2019-07-10 MED ORDER — SODIUM CHLORIDE 0.9 % IV SOLN: INTRAVENOUS | Status: DC

## 2019-07-10 MED ORDER — DEXAMETHASONE SODIUM PHOSPHATE 10 MG/ML IJ SOLN
6.0000 mg | Freq: Every day | INTRAMUSCULAR | Status: DC
  Administered 2019-07-10 – 2019-07-11 (×2): 6 mg via INTRAVENOUS
  Filled 2019-07-10 (×2): qty 1

## 2019-07-10 NOTE — Progress Notes (Addendum)
1) Spoke with Dr. Arsenio Loader, PCCM will consult. 2) D.Dimer is > 20.  Will put in for full PE dose heparin gtt, at least until we can get a CTA to rule it out (once patient more stable). 3) pt is COVID+ Procalcitonin only 0.24:  Will stop ABx  Will start remdesivir  Will start actemra

## 2019-07-10 NOTE — ED Notes (Signed)
Per MD, pt to be placed on High Flow Oxygen

## 2019-07-10 NOTE — Progress Notes (Signed)
Initial Nutrition Assessment   RD working remotely.  DOCUMENTATION CODES:   Not applicable  INTERVENTION:  Provide Ensure Enlive po TID, each supplement provides 350 kcal and 20 grams of protein.  Encourage adequate PO intake.  NUTRITION DIAGNOSIS:   Increased nutrient needs related to acute illness(COVID) as evidenced by estimated needs.  GOAL:   Patient will meet greater than or equal to 90% of their needs  MONITOR:   PO intake, Supplement acceptance, Diet advancement, Skin, Weight trends, Labs, I & O's  REASON FOR ASSESSMENT:   Malnutrition Screening Tool    ASSESSMENT:   79 year old man with hx of anxiety and HLD, presents with SOB with COVID PNA.  Pt is currently on 10 L/min HFNC.  Pt on full liquid diet trial. Pt underwent oral surgery 4 weeks ago and complains of mouth pain, swelling, and decreased po intake. RD to order nutritional supplements to aid in caloric and protein needs.   Unable to complete Nutrition-Focused physical exam at this time.   Labs and medications reviewed.   Diet Order:   Diet Order            Diet full liquid Room service appropriate? Yes; Fluid consistency: Thin  Diet effective now              EDUCATION NEEDS:   Not appropriate for education at this time  Skin:  Skin Assessment: Reviewed RN Assessment  Last BM:  Unknown  Height:   Ht Readings from Last 1 Encounters:  07/10/19 6\' 1"  (1.854 m)    Weight:   Wt Readings from Last 1 Encounters:  07/10/19 95.5 kg    Ideal Body Weight:  83.6 kg  BMI:  Body mass index is 27.78 kg/m.  Estimated Nutritional Needs:   Kcal:  09/07/19  Protein:  105-120 grams  Fluid:  >/= 2 L/day    [id number], MS, RD, LDN Pager # [phone] After hours/ weekend pager # [phone]

## 2019-07-10 NOTE — Consult Note (Signed)
Cardiology Consultation:   Patient ID: [name]; [id number]; 1941/03/16   Admit date: 2019/08/07 Date of Consult: 07/10/2019  Primary Care Provider: Raeanne Gathers, MD Primary Cardiologist: Remotely followed by Dr. Aundra Dubin, MD [phone])   Patient Profile:   [name] is a 79 y.o. male with a hx of hyperlipidemia, hypertension and anxiety who is being seen today for the evaluation of atrial fibrillation with RVR at the request of Dr. Alcario Drought.  History of Present Illness:   [name] is a 79 year old male with a history stated above who presented to [facility] on 07/10/2019 with worsening shortness of breath found to have acute hypoxic respiratory failure secondary to COVID-19 pneumonia. HPI obtained from chart review.Attempted to call to patient's room for history however very dyspneic and unable to talk at this time. Patient reportedly called [facility] from home secondary to progressive shortness of breath which he had been experiencing for approximately 3 weeks found to have an O2 saturation of 86% on room air.  EKG at that time showed atrial fibrillation with heart rate at 148 bpm.  He was then transported to the ED for further evaluation.  He apparently had oral surgery approximately 4 weeks ago and had complaints of mouth pain and swelling since that time.  In the ED, initial EKG revealed sinus tachycardia with telemetry reported as paroxysmal atrial fibrillation with RVR.  Patient temperature on arrival peaked at 101.4. Respiratory rate was noted to be in the upper 40s which required high flow NRB O2 supplementation as he would desaturate into the 70s on room air. CXR showed moderate to marked severity bilateral infiltrates, right greater than left.  COVID-19 testing was positive.  He was started on Decadron and remdesivir.  WBC was elevated at 11.7.  D-dimer found to be greater than 20 with subsequent CTA showing no pulmonary emboli however with extensive bilateral primary  peripheral pulmonary infiltrates consistent with PNA.  Lactic acid elevated at 2.3, elevated ferritin at 746. He was given Rocephin and azithromycin in the ED with plans for admission per hospitalist service and cardiology consultation for new onset atrial fibrillation with RVR. As far as the PAF, he was started on diltiazem and heparin infusions.   We have remotely followed [name], [name] in 2013-2014.  He was last seen 10/17/2012 in follow-up.  He had been admitted with atypical chest pain 02/2012 at which time a Lexiscan Myoview stress test was performed which showed an LVEF of 63% and no ischemia with a small fixed inferior perfusion defect likely secondary to attenuation.  Was continued on ASA 81 mg p.o. daily along with atorvastatin 5 mg with good lipid control.  He has not been seen by her service since that time.  Past Medical History:  Diagnosis Date  . Anxiety   . Arthritis   . Chest pain   . Dizziness   . Dyslipidemia   . Neuropathy   . Other urinary problems     Past Surgical History:  Procedure Laterality Date  . APPENDECTOMY  2012  . ROTATOR CUFF REPAIR       Prior to Admission medications   Medication Sig Start Date End Date Taking? Authorizing Provider  aspirin 81 MG chewable tablet Chew 1 tablet (81 mg total) by mouth daily. Patient not taking: Reported on Aug 07, 2019 02/28/12   Elvera Lennox, MD  cloNIDine (CATAPRES) 0.1 MG tablet Take 0.1 mg by mouth daily as needed (for blood pressure).  [provider]  ipratropium (ATROVENT) 0.06 % nasal spray Place 2 sprays into the nose 4 (four) times daily. Patient not taking: Reported on 07/31/2018 10/17/12   [name], MD  montelukast (SINGULAIR) 10 MG tablet Take 1 tablet (10 mg total) by mouth at bedtime. Patient not taking: Reported on 07/31/2018 10/17/12   [name], MD  nitroGLYCERIN (NITROSTAT) 0.4 MG SL tablet Place 1 tablet (0.4 mg total) under the tongue every 5 (five) minutes x  3 doses as needed for chest pain. 02/28/12   Lorane Gell, MD    Inpatient Medications: Scheduled Meds: . dexamethasone (DECADRON) injection  6 mg Intravenous Daily  . feeding supplement (ENSURE ENLIVE)  237 mL Oral BID BM  . mouth rinse  15 mL Mouth Rinse BID   Continuous Infusions: . diltiazem (CARDIZEM) infusion 15 mg/hr (07/10/19 0937)  . heparin 1,400 Units/hr (07/10/19 0700)  . [START ON 07/11/2019] remdesivir 100 mg in NS 100 mL     PRN Meds: acetaminophen, lip balm, ondansetron **OR** ondansetron (ZOFRAN) IV, sodium chloride, white petrolatum  Allergies:    Allergies  Allergen Reactions  . Shellfish Allergy Anaphylaxis    Social History:   Social History   Socioeconomic History  . Marital status: Single    Spouse name: Not on file  . Number of children: Not on file  . Years of education: Not on file  . Highest education level: Not on file  Occupational History  . Not on file  Tobacco Use  . Smoking status: Former Games developer  . Smokeless tobacco: Never Used  Substance and Sexual Activity  . Alcohol use: No  . Drug use: No  . Sexual activity: Not on file  Other Topics Concern  . Not on file  Social History Narrative  . Not on file   Social Determinants of Health   Financial Resource Strain:   . Difficulty of Paying Living Expenses: Not on file  Food Insecurity:   . Worried About Programme researcher, broadcasting/film/video in the Last Year: Not on file  . Ran Out of Food in the Last Year: Not on file  Transportation Needs:   . Lack of Transportation (Medical): Not on file  . Lack of Transportation (Non-Medical): Not on file  Physical Activity:   . Days of Exercise per Week: Not on file  . Minutes of Exercise per Session: Not on file  Stress:   . Feeling of Stress : Not on file  Social Connections:   . Frequency of Communication with Friends and Family: Not on file  . Frequency of Social Gatherings with Friends and Family: Not on file  . Attends Religious Services: Not on file    . Active Member of Clubs or Organizations: Not on file  . Attends Banker Meetings: Not on file  . Marital Status: Not on file  Intimate Partner Violence:   . Fear of Current or Ex-Partner: Not on file  . Emotionally Abused: Not on file  . Physically Abused: Not on file  . Sexually Abused: Not on file    Family History:   Family History  Problem Relation Age of Onset  . Heart disease Mother    Family Status:  Family Status  Relation Name Status  . Mother  Deceased  . Father  Deceased    ROS:  Please see the history of present illness.  All other ROS reviewed and negative.     Physical Exam/Data:   Vitals:   07/10/19  [id number] 07/10/19 0755 07/10/19 0840 07/10/19 0927  BP:  125/79  125/74  Pulse:  78  92  Resp: (!) 26 (!) 28  (!) 27  Temp:  97.6 F (36.4 C)    TempSrc:  Oral    SpO2:  95% 96% 91%  Weight:      Height:        Intake/Output Summary (Last 24 hours) at 07/10/2019 1127 Last data filed at 07/10/2019 0700 Gross per 24 hour  Intake 2678.37 ml  Output 300 ml  Net 2378.37 ml   Filed Weights   2019-09-13 2149 07/10/19 0500  Weight: 112.5 kg 95.5 kg   Body mass index is 27.78 kg/m.  See MD exam below  EKG:  The EKG was personally reviewed and demonstrates: 07/10/2019 atrial fibrillation with RVR, HR 136 bpm Telemetry:  Telemetry was personally reviewed and demonstrates:  Recurrent episodes of relatively brief paroxysmal AFib, apparently gradually decreasing in frequency and duration over the last 24 hours  Relevant CV Studies:  Echocardiogram 07/21/2011:  Study Conclusions   - Left ventricle: The cavity size was normal. Wall thickness  was normal. Systolic function was normal. The estimated  ejection fraction was in the range of 55% to 60%.  - Mitral valve: Mild regurgitation.  - Atrial septum: No defect or patent foramen ovale was  identified.  Transthoracic echocardiography. M-mode, complete 2D,  spectral Doppler, and color Doppler.  Height: Height:  182.9cm. Height: 72in. Weight: Weight: 100.2kg. Weight:  220.5lb. Body mass index: BMI: 30kg/m^2. Body surface  area:  BSA: 2.1757m^2. Blood pressure:   162/92. Patient  status: Outpatient. [facility]    Myocardial perfusion imaging 02/29/2012:  See epic for imaging  Laboratory Data:  Chemistry Recent Labs  Lab 2019-09-13 2156 2019-09-13 2231 07/10/19 1009  NA 136 137 141  K 5.0 3.2* 3.3*  CL 104  --  107  CO2 21*  --  21*  GLUCOSE 127*  --  183*  BUN 35*  --  32*  CREATININE 1.12  --  1.00  CALCIUM 7.8*  --  7.7*  GFRNONAA >60  --  >60  GFRAA >60  --  >60  ANIONGAP 11  --  13    Total Protein  Date Value Ref Range Status  07/10/2019 6.8 6.5 - 8.1 g/dL Final   Albumin  Date Value Ref Range Status  07/10/2019 2.4 (L) 3.5 - 5.0 g/dL Final   AST  Date Value Ref Range Status  07/10/2019 62 (H) 15 - 41 U/L Final   ALT  Date Value Ref Range Status  07/10/2019 72 (H) 0 - 44 U/L Final   Alkaline Phosphatase  Date Value Ref Range Status  07/10/2019 88 38 - 126 U/L Final   Total Bilirubin  Date Value Ref Range Status  07/10/2019 1.2 0.3 - 1.2 mg/dL Final   Hematology Recent Labs  Lab 2019-09-13 2156 2019-09-13 2231 07/10/19 1009  WBC 11.7*  --  9.9  RBC 5.40  --  4.78  HGB 15.9 14.3 13.8  HCT 46.1 42.0 41.4  MCV 85.4  --  86.6  MCH 29.4  --  28.9  MCHC 34.5  --  33.3  RDW 13.7  --  13.7  PLT PLATELET CLUMPS NOTED ON SMEAR, UNABLE TO ESTIMATE  --  135*   Cardiac EnzymesNo results for input(s): TROPONINI in the last 168 hours. No results for input(s): TROPIPOC in the last 168 hours.  BNPNo results for input(s): BNP, PROBNP in  the last 168 hours.  DDimer  Recent Labs  Lab 07/10/19 0022  DDIMER >20.00*   TSH:  Lab Results  Component Value Date   TSH 1.562 02/27/2012   Lipids: Lab Results  Component Value Date   TRIG 119 07/31/19   HgbA1c: Lab Results  Component Value Date   HGBA1C 5.7 (H) 02/27/2012     Radiology/Studies:  CT ANGIO CHEST PE W OR WO CONTRAST  Result Date: 07/10/2019 CLINICAL DATA:  Dyspnea on exertion. Increasing shortness of breath. COVID-19. EXAM: CT ANGIOGRAPHY CHEST WITH CONTRAST TECHNIQUE: Multidetector CT imaging of the chest was performed using the standard protocol during bolus administration of intravenous contrast. Multiplanar CT image reconstructions and MIPs were obtained to evaluate the vascular anatomy. CONTRAST:  65mL OMNIPAQUE IOHEXOL 350 MG/ML SOLN COMPARISON:  Chest x-ray dated 07-31-2019 FINDINGS: Cardiovascular: No pulmonary emboli. Heart size is normal. No pericardial effusion. Aortic atherosclerosis. Mediastinum/Nodes: No enlarged mediastinal, hilar, or axillary lymph nodes. Thyroid gland, trachea, and esophagus demonstrate no significant findings. Lungs/Pleura: Extensive bilateral primarily peripheral pulmonary infiltrates most severe in the right upper lobe and in both lower lobes. No effusions. Upper Abdomen: No acute abnormality. 13 mm stone in the lower mid left kidney, unchanged since the prior CT scan of the abdomen dated 11/15/2010 Musculoskeletal: No acute abnormality. Osteophytes fuse multiple levels in the lower thoracic spine. Review of the MIP images confirms the above findings. IMPRESSION: 1. No pulmonary emboli. 2. Extensive bilateral primarily peripheral pulmonary infiltrates consistent with pneumonia. Electronically Signed   By: Francene Boyers M.D.   On: 07/10/2019 11:23   DG Chest Port 1 View  Result Date: 2019/07/31 CLINICAL DATA:  Shortness of breath. EXAM: PORTABLE CHEST 1 VIEW COMPARISON:  July 31, 2018 FINDINGS: Moderate to marked severity infiltrates are seen within the bilateral lung bases and along the periphery of the mid to upper right lung. There is no evidence of a pleural effusion or pneumothorax. The heart size and mediastinal contours are within normal limits. Multilevel degenerative changes seen throughout the thoracic spine.  IMPRESSION: 1. Moderate to marked severity bilateral infiltrates, right greater than left. Electronically Signed   By: Aram Candela M.D.   On: 07-31-2019 23:10    Assessment and Plan:   1.  Atrial fibrillation with RVR in the setting of acute COVID-19 PNA illness: -Patient presented with progressive shortness of breath found to have hypoxic respiratory failure secondary to COVID-19 pneumonia.  In the ED, noted to have paroxysmal atrial fibrillation with elevated rates in the 130s therefore diltiazem infusion initiated along with heparin infusion for anticoagulation.  D-dimer found to be markedly elevated at greater than 20 with subsequent CTA with no pulmonary emboli.  Initially placed on high flow nonrebreather for oxygen supplementation given poor O2 saturations however has improved and now requires 6 L HFNC.  Atrial fibrillation likely in the setting of acute illness.  -Heart rates were controlled at this time in the 80s to low 100s -Would start metoprolol tartrate 12.5 mg p.o. twice daily and monitor for better heart rate control -Currently on diltiazem infusion at 15 mg/h>> attempt to wean in the setting of beta-blocker therapy. If he does not convert to NSR with the treatment of COVID PNA, consider DCCV in the OP setting in 3-4 weeks   -Continue heparin infusion as he is at high risk for PE with AF and COVID>>will need transition to oral therapy once more stable -CHA2DS2VASc = 3 (hypertension, age)  2.  Covid positive with multifocal pneumonia/acute hypoxic respiratory  failure: -Patient presented to [facility] with progressive shortness of breath found to be Covid positive as well as evidence of PNA on chest CTA imaging.  He was started on IV antibiotics as well as remdesivir, decadron -Follow-up lab work for [employer] includes procalcitonin (0.24), D-dimer (>20.00) -Subsequent chest CTA negative for PE however is at risk with COVID-19 -On IV heparin for paroxysmal atrial fibrillation>> continue for  now -Lactic acid, 2.1>> received 1 L fluid bolus -Maintaining O2 saturations on flow nasal cannula from 90 to 95% -Management per primary team, PCCM  3.  Hypertension: -Stable, 118/68, 125/74, 125/79 -We will plan to add metoprolol tartrate 12.5 mg p.o. twice daily   For questions or updates, please contact [facility] Please consult [email or web address] for contact info under Cardiology/STEMI.   SignedGeorgie Chard NP-C [facility] Pager: [phone] 07/10/2019 11:27 AM   I have seen and examined the patient along with Georgie Chard NP-C.  I have reviewed the chart, notes and new data.  I agree with PA/NP's note.  Key new complaints: feels that he has improved since admission. Has a sore throat, but dyspnea better. Key examination changes:   General: Alert, oriented x3, no distress, lying flat in bed Head: no evidence of trauma, PERRL, EOMI, no exophtalmos or lid lag, no myxedema, no xanthelasma; normal ears, nose and oropharynx Neck: normal jugular venous pulsations and no hepatojugular reflux; brisk carotid pulses without delay and no carotid bruits Chest: clear to auscultation, no signs of consolidation by percussion or palpation, normal fremitus, symmetrical and full respiratory excursions Cardiovascular: normal position and quality of the apical impulse, regular rhythm with occasional ectopy, normal first and widely split second heart sounds, no murmurs, rubs or gallops Abdomen: no tenderness or distention, no masses by palpation, no abnormal pulsatility or arterial bruits, normal bowel sounds, no hepatosplenomegaly Extremities: no clubbing, cyanosis or edema; 2+ radial, ulnar and brachial pulses bilaterally; 2+ right femoral, posterior tibial and dorsalis pedis pulses; 2+ left femoral, posterior tibial and dorsalis pedis pulses; no subclavian or femoral bruits Neurological: grossly nonfocal Psych: Normal mood and affect  Key new findings / data: telemetry shows recurrent episodes  of paroxysmal atrial fibrillation. These appear to be decreasing in frequency and duration over the course of the day. Currently SR w occ PACs  PLAN: Start beta blocker. I think the BP can handle metoprolol 25 mg twice daily. Transition from IV diltiazem to oral meds. Currently on IV heparin. I would recommend transition to Eliquis and then reassess need for continued long-term anticoagulation as an outpatient in 30 days, after full resolution of acute respiratory viral illness.   Thurmon Fair, MD, [facility] [phone] 07/10/2019, 1:22 PM

## 2019-07-10 NOTE — Progress Notes (Signed)
ANTICOAGULATION CONSULT NOTE  Pharmacy Consult for Heparin  Indication: atrial fibrillation  Allergies  Allergen Reactions  . Shellfish Allergy Anaphylaxis    Patient Measurements: Height: 6\' 1"  (185.4 cm) Weight: 210 lb 8.6 oz (95.5 kg) IBW/kg (Calculated) : 79.9  Vital Signs: Temp: 97.7 F (36.5 C) (02/02 1133) Temp Source: Oral (02/02 1133) BP: 118/68 (02/02 1133) Pulse Rate: 92 (02/02 0927)  Labs: Recent Labs    2019/08/07 2156 08-07-19 2156 08/07/19 2231 07/10/19 0022 07/10/19 1009  HGB 15.9   < > 14.3  --  13.8  HCT 46.1  --  42.0  --  41.4  PLT PLATELET CLUMPS NOTED ON SMEAR, UNABLE TO ESTIMATE  --   --   --  135*  APTT  --   --   --  30  --   LABPROT  --   --   --  18.6*  --   INR  --   --   --  1.6*  --   HEPARINUNFRC  --   --   --   --  0.26*  CREATININE 1.12  --   --   --  1.00   < > = values in this interval not displayed.    Estimated Creatinine Clearance: 69.9 mL/min (by C-G formula based on SCr of 1 mg/dL).   Medical History: Past Medical History:  Diagnosis Date  . Anxiety   . Arthritis   . Chest pain   . Dizziness   . Dyslipidemia   . Neuropathy   . Other urinary problems      Assessment: 79 y/o M with atrial fibrillation continues on heparin.  CT negative for PE Initial heparin level 0.26  Goal of Therapy:  Heparin level 0.3-0.7 units/ml Monitor platelets by anticoagulation protocol: Yes   Plan:  Increase heparin to 1550 units / hr Daily CBC/HL Monitor for bleeding  Thank you 62, PharmD

## 2019-07-10 NOTE — Progress Notes (Signed)
ANTICOAGULATION CONSULT NOTE - Initial Consult  Pharmacy Consult for Heparin  Indication: atrial fibrillation  Allergies  Allergen Reactions  . Shellfish Allergy Anaphylaxis    Patient Measurements: Height: 6\' 1"  (185.4 cm) Weight: 248 lb (112.5 kg) IBW/kg (Calculated) : 79.9  Vital Signs: Temp: 101.4 F (38.6 C) (02/01 2303) Temp Source: Rectal (02/01 2303) BP: 117/71 (02/02 0045) Pulse Rate: 49 (02/02 0045)  Labs: Recent Labs    07/25/2019 2156 07/20/2019 2231  HGB 15.9 14.3  HCT 46.1 42.0  PLT PLATELET CLUMPS NOTED ON SMEAR, UNABLE TO ESTIMATE  --   CREATININE 1.12  --     Estimated Creatinine Clearance: 72.6 mL/min (by C-G formula based on SCr of 1.12 mg/dL).   Medical History: Past Medical History:  Diagnosis Date  . Anxiety   . Arthritis   . Chest pain   . Dizziness   . Dyslipidemia   . Neuropathy   . Other urinary problems      Assessment: 79 y/o M with atrial fibrillation for heparin, CBC/renal function good, PTA meds reviewed. Extensive infiltrates on CXR>awating COVID-19 testing.   Goal of Therapy:  Heparin level 0.3-0.7 units/ml Monitor platelets by anticoagulation protocol: Yes   Plan:  Heparin 6000 units BOLUS Start heparin drip at 1400 units/hr 1000 HL Daily CBC/HL Monitor for bleeding  62, PharmD, BCPS Clinical Pharmacist Phone: [phone]

## 2019-07-10 NOTE — Progress Notes (Signed)
Patient seen and examined. Feeling better. Down to 7L Does not want to self prone due to PTSD from time on [employer] Agitations seems to have calmed down Will order full liquid diet trial Available as needed.  Myrla Halsted MD PCCM

## 2019-07-10 NOTE — Consult Note (Signed)
NAME:  [name], MRN:  [id number], DOB:  Dec 02, 1940, LOS: 0 ADMISSION DATE:  07/30/2019, CONSULTATION DATE:  07/11/2019 REFERRING MD:  Julian Reil, CHIEF COMPLAINT:  Shortness of breath   Brief History    [name] is a 79 year old man with hx of anxiety and HLD, here with COVID PNA.    History of present illness   Short of breath for at least 4 days.  Apparently he has been weak, with some SOB since oral surgery 4 weeks ago.  C/o mouth pain, swelling, leading to decreased PO intake.  For me, he is complaining of severe chapped lips from NRB, unable to answer any other history questions, focused only on complaining about lip pain, asking for [place] to buy him chapstick.   Past Medical History  Anxiety HLD  Significant Hospital Events     Consults:    Procedures:    Significant Diagnostic Tests:  cxr 1. Moderate to marked severity bilateral infiltrates, right greater than left.  Micro Data:    Antimicrobials:   CTX 2/1 Interim history/subjective:   Lip pain.  Unable to assess ROS.   Objective   Blood pressure 122/80, pulse 88, temperature 98.4 F (36.9 C), temperature source Oral, resp. rate (!) 35, height 6\' 1"  (1.854 m), weight 95.5 kg, SpO2 92 %.    FiO2 (%):  [100 %] 100 %   Intake/Output Summary (Last 24 hours) at 07/10/2019 0646 Last data filed at 07/10/2019 0550 Gross per 24 hour  Intake 2644.45 ml  Output 300 ml  Net 2344.45 ml   Filed Weights   08/04/2019 2149 07/10/19 0500  Weight: 112.5 kg 95.5 kg    Examination: General: Tachypneic.  Minimal increased WOB.  Tachycardic Somewhat agitated HENT: NCAT, lips dry Lungs: CTAB Cardiovascular: tachycardic, irregular rhythm Abdomen: NT,nd Extremities: no edema. Moving all ext Neuro: awake alert    Resolved Hospital Problem list     Assessment & Plan:  COVID PNA Patient is agitated, has Atrial fibrillation. Received remdesivir, decadron, tocilizumab.  Started on full dose AC as well (heparin  infusion).  Diltiazem drip for atrial fibrillation.  Agree with current management.  Given complaints regarding NRB, requested HFNC.     Best practice:  Diet: as tolerates Pain/Anxiety/Delirium protocol (if indicated):  VAP protocol (if indicated):  DVT prophylaxis: on heparin GI prophylaxis: Glucose control:  Mobility: Code Status: Full  Family Communication:  Disposition: SDU  Labs   CBC: Recent Labs  Lab 07/15/2019 2156 07/16/2019 2231  WBC 11.7*  --   NEUTROABS 10.5*  --   HGB 15.9 14.3  HCT 46.1 42.0  MCV 85.4  --   PLT PLATELET CLUMPS NOTED ON SMEAR, UNABLE TO ESTIMATE  --     Basic Metabolic Panel: Recent Labs  Lab 07/30/2019 2156 08/05/2019 2231  NA 136 137  K 5.0 3.2*  CL 104  --   CO2 21*  --   GLUCOSE 127*  --   BUN 35*  --   CREATININE 1.12  --   CALCIUM 7.8*  --    GFR: Estimated Creatinine Clearance: 62.4 mL/min (by C-G formula based on SCr of 1.12 mg/dL). Recent Labs  Lab 07/24/2019 2156 07/14/2019 2227 07/10/19 0022 07/10/19 0023  PROCALCITON  --   --  0.24  --   WBC 11.7*  --   --   --   LATICACIDVEN  --  2.3*  --  2.1*    Liver Function Tests: Recent Labs  Lab 07/13/2019 2156  AST 117*  ALT 102*  ALKPHOS 99  BILITOT 4.7*  PROT 7.8  ALBUMIN 2.8*   No results for input(s): LIPASE, AMYLASE in the last 168 hours. No results for input(s): AMMONIA in the last 168 hours.  ABG    Component Value Date/Time   PHART 7.519 (H) 07/16/2019 2231   PCO2ART 26.2 (L) 07/12/2019 2231   PO2ART 85.0 07/26/2019 2231   HCO3 21.4 07/14/2019 2231   TCO2 22 07/14/2019 2231   O2SAT 98.0 07/22/2019 2231     Coagulation Profile: Recent Labs  Lab 07/10/19 0022  INR 1.6*    Cardiac Enzymes: No results for input(s): CKTOTAL, CKMB, CKMBINDEX, TROPONINI in the last 168 hours.  HbA1C: Hgb A1c MFr Bld  Date/Time Value Ref Range Status  02/27/2012 11:55 PM 5.7 (H) <5.7 % Final    Comment:    (NOTE)                                                                        According to the ADA Clinical Practice Recommendations for 2011, when HbA1c is used as a screening test:  >=6.5%   Diagnostic of Diabetes Mellitus           (if abnormal result is confirmed) 5.7-6.4%   Increased risk of developing Diabetes Mellitus References:Diagnosis and Classification of Diabetes Mellitus,Diabetes Care,2011,34(Suppl 1):S62-S69 and Standards of Medical Care in         Diabetes - 2011,Diabetes Care,2011,34 (Suppl 1):S11-S61.    CBG: No results for input(s): GLUCAP in the last 168 hours.  Review of Systems:   Unable to assess, patient not entirely cooperative  Past Medical History  He,  has a past medical history of Anxiety, Arthritis, Chest pain, Dizziness, Dyslipidemia, Neuropathy, and Other urinary problems.   Surgical History    Past Surgical History:  Procedure Laterality Date  . APPENDECTOMY  2012  . ROTATOR CUFF REPAIR       Social History   reports that he has quit smoking. He has never used smokeless tobacco. He reports that he does not drink alcohol or use drugs.   Family History   His family history includes Heart disease in his mother.   Allergies Allergies  Allergen Reactions  . Shellfish Allergy Anaphylaxis     Home Medications  Prior to Admission medications   Medication Sig Start Date End Date Taking? Authorizing Provider  aspirin 81 MG chewable tablet Chew 1 tablet (81 mg total) by mouth daily. Patient not taking: Reported on 07/21/2019 02/28/12   Lorane Gell, MD  cloNIDine (CATAPRES) 0.1 MG tablet Take 0.1 mg by mouth daily as needed (for blood pressure).     [provider]  ipratropium (ATROVENT) 0.06 % nasal spray Place 2 sprays into the nose 4 (four) times daily. Patient not taking: Reported on 07/31/2018 10/17/12   [name], MD  montelukast (SINGULAIR) 10 MG tablet Take 1 tablet (10 mg total) by mouth at bedtime. Patient not taking: Reported on 07/31/2018 10/17/12   [name], MD    nitroGLYCERIN (NITROSTAT) 0.4 MG SL tablet Place 1 tablet (0.4 mg total) under the tongue every 5 (five) minutes x 3 doses as needed for chest pain. 02/28/12   Laza, Sorin C,  MD     Critical care time: 45 minutes

## 2019-07-10 NOTE — H&P (Addendum)
History and Physical    [name] IRS:[id number] DOB: 1940-08-18 DOA: 07/16/2019  PCP: Dalbert Mayotte, MD  Patient coming [facility]: [facility]  I have personally briefly reviewed patient's old medical records in [facility]  Chief Complaint: SOB  HPI: [name] is a 79 y.o. male with medical history significant of Anxiety, HLD.  Pt presents to ED with SOB.   He states, "I have been trying to call 911 for it for the last 4 days.  When I would call from my hotel room, I would get a 911 service in [place]." He also states he has had difficulty breathing and generalized weakness for the last 4 weeks since he underwent oral surgery.  His mouth pain and swelling has caused him to have decreased oral intake.  Denies fever/chills, chest pain, cough, vomiting, diarrhea, abdominal pain, urinary symptoms, syncope, or any other complaints.   ED Course: On evaluation, the patient is quite ill: 1) going in and out of A.Fib RVR 2) Tm 101.4 3) RR in the upper 40s 4) new O2 requirement, desats down into the 70s on RA, currently satting upper 90s-100% but this is on an NRB 5) CXR showing bilateral pneumonia  WBC 11.7k  Given rocephin / azithro in ED  ABG showing respiratory alkalosis: 7.51 / 26 / 85 but this on FIO2 of 100%.  Review of Systems: As per HPI, otherwise all review of systems negative.  Past Medical History:  Diagnosis Date  . Anxiety   . Arthritis   . Chest pain   . Dizziness   . Dyslipidemia   . Neuropathy   . Other urinary problems     Past Surgical History:  Procedure Laterality Date  . APPENDECTOMY  2012  . ROTATOR CUFF REPAIR       reports that he has quit smoking. He has never used smokeless tobacco. He reports that he does not drink alcohol or use drugs.  Allergies  Allergen Reactions  . Shellfish Allergy Anaphylaxis    Family History  Problem Relation Age of Onset  . Heart disease Mother      Prior to Admission medications   Medication Sig  Start Date End Date Taking? Authorizing Provider  aspirin 81 MG chewable tablet Chew 1 tablet (81 mg total) by mouth daily. Patient not taking: Reported on 07/27/2019 02/28/12   Lorane Gell, MD  cloNIDine (CATAPRES) 0.1 MG tablet Take 0.1 mg by mouth daily as needed (for blood pressure).     [provider]  ipratropium (ATROVENT) 0.06 % nasal spray Place 2 sprays into the nose 4 (four) times daily. Patient not taking: Reported on 07/31/2018 10/17/12   [name], MD  montelukast (SINGULAIR) 10 MG tablet Take 1 tablet (10 mg total) by mouth at bedtime. Patient not taking: Reported on 07/31/2018 10/17/12   [name], MD  nitroGLYCERIN (NITROSTAT) 0.4 MG SL tablet Place 1 tablet (0.4 mg total) under the tongue every 5 (five) minutes x 3 doses as needed for chest pain. 02/28/12   Lorane Gell, MD    Physical Exam: Vitals:   07/30/2019 2315 08/01/2019 2345 07/10/19 0000 07/10/19 0015  BP: 133/75 121/86 116/90 103/89  Pulse:  (!) 136 (!) 181 (!) 142  Resp: 11 (!) 30 (!) 33 (!) 26  Temp:      TempSrc:      SpO2:  97% 100% 96%  Weight:      Height:        Constitutional:  Ill appearing Eyes: PERRL, lids and conjunctivae normal ENMT: Mucous membranes are dry. No trismus nor neck swelling. Neck: normal, supple, no masses, no thyromegaly Respiratory: Tachypnea and accessory muscle use present. Cardiovascular: IRR, IRR Abdomen: no tenderness, no masses palpated. No hepatosplenomegaly. Bowel sounds positive.  Musculoskeletal: no clubbing / cyanosis. No joint deformity upper and lower extremities. Good ROM, no contractures. Normal muscle tone.  Skin: no rashes, lesions, ulcers. No induration Neurologic: CN 2-12 grossly intact. Sensation intact, DTR normal. Strength 5/5 in all 4.  Psychiatric: Normal judgment and insight. Alert and oriented x 3. Normal mood.    Labs on Admission: I have personally reviewed following labs and imaging studies  CBC: Recent Labs  Lab 08/05/2019 2156  07/31/2019 2231  WBC 11.7*  --   NEUTROABS 10.5*  --   HGB 15.9 14.3  HCT 46.1 42.0  MCV 85.4  --   PLT PLATELET CLUMPS NOTED ON SMEAR, UNABLE TO ESTIMATE  --    Basic Metabolic Panel: Recent Labs  Lab 08/05/2019 2156 07/15/2019 2231  NA 136 137  K 5.0 3.2*  CL 104  --   CO2 21*  --   GLUCOSE 127*  --   BUN 35*  --   CREATININE 1.12  --   CALCIUM 7.8*  --    GFR: Estimated Creatinine Clearance: 72.6 mL/min (by C-G formula based on SCr of 1.12 mg/dL). Liver Function Tests: Recent Labs  Lab 08/04/2019 2156  AST 117*  ALT 102*  ALKPHOS 99  BILITOT 4.7*  PROT 7.8  ALBUMIN 2.8*   No results for input(s): LIPASE, AMYLASE in the last 168 hours. No results for input(s): AMMONIA in the last 168 hours. Coagulation Profile: No results for input(s): INR, PROTIME in the last 168 hours. Cardiac Enzymes: No results for input(s): CKTOTAL, CKMB, CKMBINDEX, TROPONINI in the last 168 hours. BNP (last 3 results) No results for input(s): PROBNP in the last 8760 hours. HbA1C: No results for input(s): HGBA1C in the last 72 hours. CBG: No results for input(s): GLUCAP in the last 168 hours. Lipid Profile: No results for input(s): CHOL, HDL, LDLCALC, TRIG, CHOLHDL, LDLDIRECT in the last 72 hours. Thyroid Function Tests: No results for input(s): TSH, T4TOTAL, FREET4, T3FREE, THYROIDAB in the last 72 hours. Anemia Panel: No results for input(s): VITAMINB12, FOLATE, FERRITIN, TIBC, IRON, RETICCTPCT in the last 72 hours. Urine analysis:    Component Value Date/Time   COLORURINE YELLOW 07/31/2018 0825   APPEARANCEUR CLEAR 07/31/2018 0825   LABSPEC 1.016 07/31/2018 0825   PHURINE 6.0 07/31/2018 0825   GLUCOSEU NEGATIVE 07/31/2018 0825   HGBUR NEGATIVE 07/31/2018 0825   BILIRUBINUR NEGATIVE 07/31/2018 0825   KETONESUR NEGATIVE 07/31/2018 0825   PROTEINUR NEGATIVE 07/31/2018 0825   UROBILINOGEN 1.0 02/27/2012 2015   NITRITE NEGATIVE 07/31/2018 0825   LEUKOCYTESUR NEGATIVE 07/31/2018 0825     Radiological Exams on Admission: DG Chest Port 1 View  Result Date: 07/27/2019 CLINICAL DATA:  Shortness of breath. EXAM: PORTABLE CHEST 1 VIEW COMPARISON:  July 31, 2018 FINDINGS: Moderate to marked severity infiltrates are seen within the bilateral lung bases and along the periphery of the mid to upper right lung. There is no evidence of a pleural effusion or pneumothorax. The heart size and mediastinal contours are within normal limits. Multilevel degenerative changes seen throughout the thoracic spine. IMPRESSION: 1. Moderate to marked severity bilateral infiltrates, right greater than left. Electronically Signed   By: Aram Candela M.D.   On: 07/24/2019 23:10    EKG:  Independently reviewed.  Assessment/Plan Principal Problem:   Multifocal pneumonia Active Problems:   Hypertension   Suspected COVID-19 virus infection   Sepsis (HCC)   Acute respiratory failure with hypoxia (HCC)   Atrial fibrillation with RVR (HCC)   ARDS (adult respiratory distress syndrome) (HCC)    1. Multifocal PNA - with acute hypoxic resp failure and sepsis - CAP vs COVID - patient meets ARDS criteria 1. COVID pathway 2. Getting CAP ABx in ED 3. Decadron 4. COVID test pending 5. Procalcitonin pending 6. CRP 24 7. IVF: 1L bolus in ED, will stop maint fluids since he meets ARDS criteria 8. Cont pulse ox and tele monitor 9. If COVID positive: start remdesivir 10. If COVID positive and pro calcitonin negative: consider actemra 11. D.Dimer pending 12. Consider CTA depending on D.Dimer results 13. Also for history of oral surgery / ? Of odontogenic infection: get CT head/neck soft tissue once patient more stable to be able to go to CT scanner. 14. Daily labs 15. Will get PCCM consult since he meets ARDS criteria 2. A.Fib vs flutter RVR - patient going between S.Tach in the 110s and A.Fib/Flutter RVR with rate in the 140s. 1. Cardizem gtt, no bolus 2. Tele monitor 3. Heparin gtt 4. CHADS-VASC is  at least a 2 (age). 3. HTN - 1. Holding home BP meds  DVT prophylaxis: Heparin gtt for A.Fib + COVID, not sure yet about PE Code Status: Full Family Communication: No family in room Disposition Plan: TBD Consults called: Calling PCCM Admission status: Admit to inpatient  Severity of Illness: The appropriate patient status for this patient is INPATIENT. Inpatient status is judged to be reasonable and necessary in order to provide the required intensity of service to ensure the patient's safety. The patient's presenting symptoms, physical exam findings, and initial radiographic and laboratory data in the context of their chronic comorbidities is felt to place them at high risk for further clinical deterioration. Furthermore, it is not anticipated that the patient will be medically stable for discharge from the hospital within 2 midnights of admission. The following factors support the patient status of inpatient.   IP status due to multifocal PNA with new O2 requirement, A.Fib RVR new onset.   * I certify that at the point of admission it is my clinical judgment that the patient will require inpatient hospital care spanning beyond 2 midnights from the point of admission due to high intensity of service, high risk for further deterioration and high frequency of surveillance required.*    Read Bonelli M. DO [facility]  How to contact the [facility] Attending or Consulting provider 7A - 7P or covering provider during after hours 7P -7A, for this patient?  1. Check the care team in [facility] and look for a) attending/consulting TR[facility] provider listed and b) the [facility] team listed 2. Log into [email or web address]  Amion Physician Scheduling and messaging for groups and whole hospitals  On call and physician scheduling software for group practices, residents, hospitalists and other medical providers for call, clinic, rotation and shift schedules. OnCall [employer] is a hospital-wide system for scheduling doctors  and paging doctors on call. EasyPlot is for scientific plotting and data analysis.  [email or web address]  and use [place]'s universal password to access. If you do not have the password, please contact the hospital operator.  3. Locate the [facility] provider you are looking for under [facility] and page to a number that you can be directly reached. 4. If you still have difficulty  reaching the provider, please page the [facility] (Director on Call) for the Hospitalists listed on amion for assistance.  07/10/2019, 12:38 AM

## 2019-07-10 NOTE — Progress Notes (Addendum)
Patient ID: [name], male   DOB: 1940-11-30, 79 y.o.   MRN: [id number] Patient was admitted early this morning for worsening shortness of breath and was found to have acute hypoxic respiratory failure secondary to COVID-19 pneumonia with concerns for ARDS.  He was started on Decadron, remdesivir and was also treated with Actemra.  PCCM was consulted.  He was started on heparin drip for D-dimer more than 20.  Procalcitonin was only 0.24; hence antibiotics were stopped.  He was also started on Cardizem drip for paroxysmal A. fib with RVR, new onset.  I have reviewed patient's medical records including this morning's H&P, labs, vitals, medications myself.  I have seen and examined the patient at bedside and plan of care discussed with him.  Overall prognosis is guarded.  He is currently full code.  Will request palliative care evaluation as well.  Repeat a.m. labs.  Obtain CT angio of the chest.  Oxygen requirement improving, currently on 6 L high flow nasal cannula.  Will request cardiology evaluation as well.

## 2019-07-11 DIAGNOSIS — Z515 Encounter for palliative care: Secondary | ICD-10-CM

## 2019-07-11 DIAGNOSIS — U071 COVID-19: Secondary | ICD-10-CM

## 2019-07-11 DIAGNOSIS — J1282 Pneumonia due to coronavirus disease 2019: Secondary | ICD-10-CM

## 2019-07-11 DIAGNOSIS — Z7189 Other specified counseling: Secondary | ICD-10-CM

## 2019-07-11 LAB — D-DIMER, QUANTITATIVE: D-Dimer, Quant: >20 ug/mL-FEU — ABNORMAL HIGH (ref 0.00–0.50)

## 2019-07-11 LAB — PROCALCITONIN: Procalcitonin: 0.19 ng/mL

## 2019-07-11 LAB — CBC WITH DIFFERENTIAL/PLATELET
Abs Immature Granulocytes: 0.08 10*3/uL — ABNORMAL HIGH (ref 0.00–0.07)
Basophils Absolute: 0 10*3/uL (ref 0.0–0.1)
Basophils Relative: 0 %
Eosinophils Absolute: 0 10*3/uL (ref 0.0–0.5)
Eosinophils Relative: 0 %
HCT: 38.4 % — ABNORMAL LOW (ref 39.0–52.0)
Hemoglobin: 13 g/dL (ref 13.0–17.0)
Immature Granulocytes: 1 %
Lymphocytes Relative: 6 %
Lymphs Abs: 0.6 10*3/uL — ABNORMAL LOW (ref 0.7–4.0)
MCH: 29.1 pg (ref 26.0–34.0)
MCHC: 33.9 g/dL (ref 30.0–36.0)
MCV: 85.9 fL (ref 80.0–100.0)
Monocytes Absolute: 0.6 10*3/uL (ref 0.1–1.0)
Monocytes Relative: 6 %
Neutro Abs: 9.1 10*3/uL — ABNORMAL HIGH (ref 1.7–7.7)
Neutrophils Relative %: 87 %
Platelets: 134 10*3/uL — ABNORMAL LOW (ref 150–400)
RBC: 4.47 MIL/uL (ref 4.22–5.81)
RDW: 13.7 % (ref 11.5–15.5)
WBC: 10.4 10*3/uL (ref 4.0–10.5)
nRBC: 0 % (ref 0.0–0.2)

## 2019-07-11 LAB — COMPREHENSIVE METABOLIC PANEL
ALT: 61 U/L — ABNORMAL HIGH (ref 0–44)
AST: 52 U/L — ABNORMAL HIGH (ref 15–41)
Albumin: 2.3 g/dL — ABNORMAL LOW (ref 3.5–5.0)
Alkaline Phosphatase: 84 U/L (ref 38–126)
Anion gap: 10 (ref 5–15)
BUN: 35 mg/dL — ABNORMAL HIGH (ref 8–23)
CO2: 24 mmol/L (ref 22–32)
Calcium: 7.7 mg/dL — ABNORMAL LOW (ref 8.9–10.3)
Chloride: 108 mmol/L (ref 98–111)
Creatinine, Ser: 1.06 mg/dL (ref 0.61–1.24)
GFR calc Af Amer: >60 mL/min (ref >60)
GFR calc non Af Amer: >60 mL/min (ref >60)
Glucose, Bld: 108 mg/dL — ABNORMAL HIGH (ref 70–99)
Potassium: 3.4 mmol/L — ABNORMAL LOW (ref 3.5–5.1)
Sodium: 142 mmol/L (ref 135–145)
Total Bilirubin: 1 mg/dL (ref 0.3–1.2)
Total Protein: 5.9 g/dL — ABNORMAL LOW (ref 6.5–8.1)

## 2019-07-11 LAB — BRAIN NATRIURETIC PEPTIDE: B Natriuretic Peptide: 88.7 pg/mL (ref 0.0–100.0)

## 2019-07-11 LAB — C-REACTIVE PROTEIN: CRP: 13.6 mg/dL — ABNORMAL HIGH (ref <1.0)

## 2019-07-11 LAB — FERRITIN: Ferritin: 822 ng/mL — ABNORMAL HIGH (ref 24–336)

## 2019-07-11 LAB — MAGNESIUM: Magnesium: 2.7 mg/dL — ABNORMAL HIGH (ref 1.7–2.4)

## 2019-07-11 LAB — TSH: TSH: 0.287 u[IU]/mL — ABNORMAL LOW (ref 0.350–4.500)

## 2019-07-11 MED ORDER — TOCILIZUMAB 400 MG/20ML IV SOLN: 8.0000 mg/kg | Freq: Once | INTRAVENOUS | Status: DC

## 2019-07-11 MED ORDER — METOPROLOL TARTRATE 50 MG PO TABS
50.0000 mg | ORAL_TABLET | Freq: Two times a day (BID) | ORAL | Status: DC
  Administered 2019-07-11 – 2019-07-12 (×3): 50 mg via ORAL
  Filled 2019-07-11 (×3): qty 1

## 2019-07-11 MED ORDER — METHYLPREDNISOLONE SODIUM SUCC 125 MG IJ SOLR
60.0000 mg | INTRAMUSCULAR | Status: DC
  Administered 2019-07-11: 60 mg via INTRAVENOUS
  Filled 2019-07-11: qty 2

## 2019-07-11 MED ORDER — DILTIAZEM HCL 60 MG PO TABS
60.0000 mg | ORAL_TABLET | Freq: Three times a day (TID) | ORAL | Status: DC
  Administered 2019-07-11 – 2019-07-14 (×9): 60 mg via ORAL
  Filled 2019-07-11 (×10): qty 1

## 2019-07-11 MED ORDER — DOXYCYCLINE HYCLATE 100 MG PO TABS
100.0000 mg | ORAL_TABLET | Freq: Two times a day (BID) | ORAL | Status: DC
  Administered 2019-07-11 – 2019-07-14 (×6): 100 mg via ORAL
  Filled 2019-07-11 (×7): qty 1

## 2019-07-11 MED ORDER — ENOXAPARIN SODIUM 100 MG/ML ~~LOC~~ SOLN
95.0000 mg | Freq: Two times a day (BID) | SUBCUTANEOUS | Status: DC
  Administered 2019-07-11 – 2019-07-14 (×6): 95 mg via SUBCUTANEOUS
  Filled 2019-07-11 (×7): qty 1

## 2019-07-11 MED ORDER — POTASSIUM CHLORIDE CRYS ER 20 MEQ PO TBCR
40.0000 meq | EXTENDED_RELEASE_TABLET | Freq: Once | ORAL | Status: AC
  Administered 2019-07-11: 14:00:00 40 meq via ORAL
  Filled 2019-07-11: qty 2

## 2019-07-11 NOTE — Evaluation (Signed)
Physical Therapy Evaluation Patient Details Name: [name] MRN: [id number] DOB: June 03, 1941 Today's Date: 07/11/2019   History of Present Illness  79 y.o. male with medical history significant of Anxiety, HLD, neuropathy presented to ED Aug 03, 2019 for  difficulty breathing and generalized weakness for the last 4 weeks. Found + afib, fever, RR 40s, sats 70s on RA. + COVID with pna. CT angio chest negative for PE  Clinical Impression   Pt admitted with above diagnosis. Patient was independent PTA, not using any DME. He lives alone in an extended stay hotel. Since COVID began, his family has brought his groceries and left them outside his door. He is VERY anxious with lines, tubes, bells exacerbating his anxiety. Incr time with relaxation and breathing techniques when his sats dropped to 82% while on 15L HFNC with bed to chair transfer.  Pt currently with functional limitations due to the deficits listed below (see PT Problem List). Pt will benefit from skilled PT to increase their independence and safety with mobility to allow discharge to the venue listed below.       Follow Up Recommendations No PT follow up;Supervision - Intermittent(if slower progress, may need PT on discharge)    Equipment Recommendations  None recommended by PT    Recommendations for Other Services OT consult     Precautions / Restrictions Precautions Precautions: Fall;Other (comment) Precaution Comments: desaturates easily      Mobility  Bed Mobility               General bed mobility comments: sitting EOB with RN on arrival  Transfers Overall transfer level: Needs assistance Equipment used: None Transfers: Sit to/from [employer] Transfers Sit to Stand: Min guard Stand pivot transfers: Min guard       General transfer comment: uses UEs to push off; no assist to steady himself  Ambulation/Gait             General Gait Details: uanble due to decr sats on 15L  Stairs             Wheelchair Mobility    Modified Rankin (Stroke Patients Only)       Balance Overall balance assessment: No apparent balance deficits (not formally assessed)                                           Pertinent Vitals/Pain Pain Assessment: Faces Faces Pain Scale: Hurts even more Pain Location: headache Pain Descriptors / Indicators: Throbbing;Headache Pain Intervention(s): Limited activity within patient's tolerance;Monitored during session;Repositioned;Relaxation    Home Living Family/patient expects to be discharged to:: Private residence Living Arrangements: Alone Available Help at Discharge: Family Type of Home: Other(Comment)(lives in extended care hotel) Home Access: Stairs to enter   Secretary/administrator of Steps: 1 Home Layout: One level Home Equipment: None Additional Comments: has brother and sister-in-law live nearby and they have been doing his grocery shopping for him since COVID; he drives (last time was to dentist--where he insists he got COVID)    Prior Function Level of Independence: Independent               Hand Dominance        Extremity/Trunk Assessment   Upper Extremity Assessment Upper Extremity Assessment: Defer to OT evaluation    Lower Extremity Assessment Lower Extremity Assessment: Generalized weakness    Cervical / Trunk Assessment Cervical / Trunk Assessment: Kyphotic  Communication   Communication: No difficulties  Cognition Arousal/Alertness: Awake/alert Behavior During Therapy: Restless;Impulsive Overall Cognitive Status: Within Functional Limits for tasks assessed                                 General Comments: high anxiety; very difficult to slow his breathing; talks frequently (also drives his anxiety); responded ok to visual imagery thinking of [place] where he used to live      General Comments General comments (skin integrity, edema, etc.): RN reports had incr pt from 11L to  15L prior to moving to EOB; decr to 82% with transfer; required 15 minutes of coaching (PLB, not to talk, visual imagery) with pt getting as high as 88% and drop back down--at lowest 77%) before he finally got to 90%     Exercises Other Exercises Other Exercises: educated in proper use of flutter valve, followed by use of IS. Educated to only do 1-3 breaths at a time and to aim to get 10 breaths/hour.    Assessment/Plan    PT Assessment Patient needs continued PT services  PT Problem List Decreased strength;Decreased activity tolerance;Decreased mobility;Decreased knowledge of use of DME;Decreased knowledge of precautions;Cardiopulmonary status limiting activity       PT Treatment Interventions DME instruction;Gait training;Functional mobility training;Therapeutic activities;Therapeutic exercise;Cognitive remediation;Patient/family education    PT Goals (Current goals can be found in the Care Plan section)  Acute Rehab PT Goals Patient Stated Goal: get rid of all these lines and tubes PT Goal Formulation: With patient Time For Goal Achievement: 07/25/19 Potential to Achieve Goals: Good    Frequency Min 3X/week   Barriers to discharge Decreased caregiver support lives alone, but family nearby    Co-evaluation               AM-PAC PT "6 Clicks" Mobility  Outcome Measure Help needed turning from your back to your side while in a flat bed without using bedrails?: None Help needed moving from lying on your back to sitting on the side of a flat bed without using bedrails?: A Little Help needed moving to and from a bed to a chair (including a wheelchair)?: A Little Help needed standing up from a chair using your arms (e.g., wheelchair or bedside chair)?: A Little Help needed to walk in hospital room?: Total Help needed climbing 3-5 steps with a railing? : Total 6 Click Score: 15    End of Session Equipment Utilized During Treatment: Oxygen Activity Tolerance: Treatment  limited secondary to medical complications (Comment) Patient left: in chair;with call bell/phone within reach(RN did not think pt needed chair alarm and not set up) Nurse Communication: Mobility status;Other (comment)(sats) PT Visit Diagnosis: Difficulty in walking, not elsewhere classified (R26.2);Muscle weakness (generalized) (M62.81)    Time: 1027-2536 PT Time Calculation (min) (ACUTE ONLY): 41 min   Charges:   PT Evaluation $PT Eval High Complexity: 1 High PT Treatments $Therapeutic Exercise: 8-22 mins $Self Care/Home Management: 8-22         Arby Barrette, PT Pager [phone]   Rexanne Mano 07/11/2019, 11:59 AM

## 2019-07-11 NOTE — Progress Notes (Signed)
Patient sats 80 on 15L [place] lying on side in bed. Patient stated that he couldn't prone, but would be willing to get back into the chair as long as he had enough blankets. RN put patient in chair, O2 sats are now 88% and increasing.

## 2019-07-11 NOTE — Progress Notes (Signed)
Patient now on 9L HFNC down from 11L.

## 2019-07-11 NOTE — Evaluation (Signed)
Clinical/Bedside Swallow Evaluation Patient Details  Name: [name] MRN: [id number] Date of Birth: 11/26/1940  Today's Date: 07/11/2019 Time: SLP Start Time (ACUTE ONLY): 1027 SLP Stop Time (ACUTE ONLY): 0952 SLP Time Calculation (min) (ACUTE ONLY): 35 min  Past Medical History:  Past Medical History:  Diagnosis Date  . Anxiety   . Arthritis   . Chest pain   . Dizziness   . Dyslipidemia   . Neuropathy   . Other urinary problems    Past Surgical History:  Past Surgical History:  Procedure Laterality Date  . APPENDECTOMY  2012  . ROTATOR CUFF REPAIR     HPI:  79yo male admitted Jul 13, 2019 with SOB, acute hypoxic respiratory failure due to COVID PNA. PMH: anxiety, HLD. Oral curgery in January 2021. CXR - bilateral PNA   Assessment / Plan / Recommendation Clinical Impression  Pt seen at bedside for evaluation of swallow function and safety in the setting of significant COVID PNA. Pt recently underwent oral surgery with removal of several teeth. For this reason, he is currently on full liquids, due to inability to chew solids. Pt exhibits adequate oral motor strength and function. His O2 sats remained in the upper 80s throughout this evaluation.   Pt accepted po meds from RN, and appeared to tolerate all meds with sips of thin liquid. Pt also tolerated trials of puree without overt s/s aspiration. Pt reported that taking a deep breath caused him to cough, but that he didn't have difficulty swallowing. Prior to oral surgery, pt reports tolerance of regular solids and thin liquids, eating mostly fruits and vegetables, little meat. Will continue current diet (full liquid/thin liquids) until oral pain will allow solid textures.   Pt was receptive to education regarding use of yankauer to remove coughed up secretions. He was discouraged from using a kleenex to gather secretions, for infection control as well as to minimize irritation to his already dry and sore lips. SLP will follow for diet  tolerance and education. RN and MD informed of results and recommendations.    SLP Visit Diagnosis: Dysphagia, unspecified (R13.10)    Aspiration Risk  Moderate aspiration risk    Diet Recommendation Other (Comment);Thin liquid(full liquid)   Liquid Administration via: Cup;Straw Medication Administration: Whole meds with liquid Supervision: Patient able to self feed;Staff to assist with self feeding(for energy conservation) Compensations: Slow rate;Small sips/bites;Minimize environmental distractions Postural Changes: Seated upright at 90 degrees;Remain upright for at least 30 minutes after po intake    Other  Recommendations Oral Care Recommendations: Oral care BID   Follow up Recommendations Other (comment)(TBD)      Frequency and Duration min 1 x/week  1 week;2 weeks       Prognosis Prognosis for Safe Diet Advancement: Good      Swallow Study   General Date of Onset: 07/11/19 HPI: 79yo male admitted 07/13/19 with SOB, acute hypoxic respiratory failure due to COVID PNA. PMH: anxiety, HLD. Oral curgery in January 2021. CXR - bilateral PNA Type of Study: Bedside Swallow Evaluation Previous Swallow Assessment: none Diet Prior to this Study: Other (Comment)(full liquid) Temperature Spikes Noted: No Respiratory Status: Nasal cannula History of Recent Intubation: No Behavior/Cognition: Alert;Cooperative;Pleasant mood Oral Cavity Assessment: Within Functional Limits Oral Care Completed by SLP: No Oral Cavity - Dentition: Missing dentition Vision: Functional for self-feeding Self-Feeding Abilities: Able to feed self;Needs assist;Needs set up Patient Positioning: Upright in bed Baseline Vocal Quality: Normal Volitional Cough: Weak Volitional Swallow: Able to elicit    Oral/Motor/Sensory Function Overall  Oral Motor/Sensory Function: Within functional limits   Ice Chips Ice chips: Not tested   Thin Liquid Thin Liquid: Within functional limits Presentation: Straw    Nectar  Thick Nectar Thick Liquid: Not tested   Honey Thick Honey Thick Liquid: Not tested   Puree Puree: Within functional limits Presentation: Self Fed;Spoon   Solid     Solid: Not tested(recent oral surgery with removal of several teeth)     Icholas Irby B. Murvin Natal, [facility], CCC-SLP Speech Language Pathologist Office: [phone] Pager: [phone]  Leigh Aurora 07/11/2019,10:03 AM

## 2019-07-11 NOTE — Progress Notes (Signed)
PROGRESS NOTE                                                                                                                                                                                                             Patient Demographics:    [name], is a 79 y.o. male, DOB - 01-12-1941, KDX:[id number]  Outpatient Primary MD for the patient is Raeanne Gathers, MD    LOS - 1  Admit date - 07/30/2019    Chief Complaint  Patient presents with  . Shortness of Breath       Brief Narrative  [name] is a 79 y.o. male with medical history significant of Anxiety, HLD.  Pt presented to ED with SOB.   He was apparently sick for 3 to 4 days before seeking medical attention, upon arrival to the ER his work-up suggested acute hypoxic respiratory failure due to COVID-19 pneumonia, he was also found to have new onset A. fib and RVR.    Subjective:    [name] has, No headache, No chest pain, No abdominal pain - No Nausea, No new weakness tingling or numbness, +ve cough and SOB.   Assessment  & Plan :     1. Acute Hypoxic Resp. Failure due to Acute Covid 19 Viral Pneumonitis during the ongoing 2020 Covid 19 Pandemic - he has severe disease, currently on 12 L high flow oxygen, in moderate to severe respiratory distress, extremely elevated CRP and D-dimer despite steroids Eliquis.  At this time I will increase his IV steroids, continue remdesivir, after informed consent he already received Actemra on 07/10/2019.  Continue to monitor clinically, overall condition is tenuous if he declines further he will be an extremely poor candidate for mechanical ventilation or intubation, he would benefit from comfort measures at that point.  Tried contacting his brother but went to answering machine.  Will try again tomorrow.  Encouraged the patient to sit up in chair in the daytime use I-S and flutter valve for pulmonary toiletry  and then prone in bed when at night.    SpO2: 95 % O2 Flow Rate (L/min): 11 L/min FiO2 (%): 95 %  Recent Labs  Lab 07/26/2019 2305 07/10/19 0012 07/10/19 0022 07/10/19 1009 07/11/19 0531  CRP 24.0*  --   --  21.1* 13.6*  DDIMER  --   --  >  20.00* >20.00* >20.00*  FERRITIN  --   --  746* 806* 822*  PROCALCITON  --   --  0.24  --  0.19  SARSCOV2NAA  --  POSITIVE*  --   --   --     Hepatic Function Latest Ref Rng & Units 07/11/2019 07/10/2019 07/25/2019  Total Protein 6.5 - 8.1 g/dL 5.9(L) 6.8 7.8  Albumin 3.5 - 5.0 g/dL 2.3(L) 2.4(L) 2.8(L)  AST 15 - 41 U/L 52(H) 62(H) 117(H)  ALT 0 - 44 U/L 61(H) 72(H) 102(H)  Alk Phosphatase 38 - 126 U/L 84 88 99  Total Bilirubin 0.3 - 1.2 mg/dL 1.0 1.2 4.7(H)     2.  New onset A. fib RVR [name] 2 score of at least 2.  Rate in good control, will place him on oral Cardizem and beta-blocker and titrate off his Cardizem drip.  Full anticoagulation.  Will check TSH.  3.  Extremely elevated D-dimer.  Highly suspicious for a blood clot, stop Eliquis and place him on Lovenox, CT angiogram chest negative but will check lower extremity venous duplex.  4.  Hypertension.  Currently on Cardizem and beta-blocker will monitor and adjust as needed.     Condition - Extremely Guarded  Family Communication  :   Left message for patient's brother in detail on 07/11/2019 about poor prognosis  Code Status : For now full code but extremely poor candidate for intubation and mechanical ventilation.  Diet :    Diet Order            Diet full liquid Room service appropriate? Yes; Fluid consistency: Thin  Diet effective now               Disposition Plan  : Severely hypoxic on 15 L of oxygen, on full dose Lovenox and full Covid treatment.  Consults  : Cardiology  Procedures  :    CT angiogram.  No PE  Venous ultrasound legs.  PUD Prophylaxis :  None  DVT Prophylaxis  :  Lovenox    Lab Results  Component Value Date   PLT 134 (L) 07/11/2019     Inpatient Medications  Scheduled Meds: . feeding supplement (ENSURE ENLIVE)  237 mL Oral BID BM  . mouth rinse  15 mL Mouth Rinse BID  . methylPREDNISolone (SOLU-MEDROL) injection  60 mg Intravenous Q24H  . metoprolol tartrate  25 mg Oral BID   Continuous Infusions: . diltiazem (CARDIZEM) infusion 15 mg/hr (07/11/19 0208)  . remdesivir 100 mg in NS 100 mL 100 mg (07/11/19 0934)   PRN Meds:.acetaminophen, lip balm, ondansetron **OR** ondansetron (ZOFRAN) IV, phenol, sodium chloride, white petrolatum  Antibiotics  :    Anti-infectives (From admission, onward)   Start     Dose/Rate Route Frequency Ordered Stop   07/11/19 1000  remdesivir 100 mg in sodium chloride 0.9 % 100 mL IVPB     100 mg 200 mL/hr over 30 Minutes Intravenous Daily 07/10/19 0223 07/15/19 0959   07/10/19 0300  remdesivir 200 mg in sodium chloride 0.9% 250 mL IVPB     200 mg 580 mL/hr over 30 Minutes Intravenous Once 07/10/19 0223 07/10/19 0551   07/30/2019 2345  cefTRIAXone (ROCEPHIN) 2 g in sodium chloride 0.9 % 100 mL IVPB  Status:  Discontinued     2 g 200 mL/hr over 30 Minutes Intravenous Every 24 hours 07/13/2019 2337 07/10/19 0222   07/12/2019 2345  azithromycin (ZITHROMAX) 500 mg in sodium chloride 0.9 % 250 mL IVPB  Status:  Discontinued     500 mg 250 mL/hr over 60 Minutes Intravenous Every 24 hours 07/12/2019 2337 07/10/19 0222       Time Spent in minutes  30   Lala Lund M.D on 07/11/2019 at 1:31 PM  To page go to [email or web address] - password [facility]  [facility] -  Office  [phone]    See all Orders from Rogers for further details    Objective:   Vitals:   07/11/19 0510 07/11/19 0826 07/11/19 0925 07/11/19 1246  BP: 103/62 109/74  124/76  Pulse: 81 92 90 78  Resp: (!) 28 (!) 21  19  Temp: 98.2 F (36.8 C) 97.9 F (36.6 C)  98.2 F (36.8 C)  TempSrc: Axillary Oral  Oral  SpO2: 94% 90%  95%  Weight:      Height:        Wt Readings from Last 3 Encounters:  07/10/19 95.5 kg   07/31/18 90.7 kg  10/17/12 102.1 kg     Intake/Output Summary (Last 24 hours) at 07/11/2019 1331 Last data filed at 07/11/2019 1247 Gross per 24 hour  Intake 118.25 ml  Output 525 ml  Net -406.75 ml     Physical Exam  Awake Alert,  No new F.N deficits, Normal affect .AT,PERRAL Supple Neck,No JVD, No cervical lymphadenopathy appriciated.  Symmetrical Chest wall movement, Good air movement bilaterally, CTAB iRRR,No Gallops,Rubs or new Murmurs, No Parasternal Heave +ve B.Sounds, Abd Soft, No tenderness, No organomegaly appriciated, No rebound - guarding or rigidity. No Cyanosis, Clubbing or edema, No new Rash or bruise     Data Review:    CBC Recent Labs  Lab 07/20/2019 2156 07/30/2019 2231 07/10/19 1009 07/11/19 0531  WBC 11.7*  --  9.9 10.4  HGB 15.9 14.3 13.8 13.0  HCT 46.1 42.0 41.4 38.4*  PLT PLATELET CLUMPS NOTED ON SMEAR, UNABLE TO ESTIMATE  --  135* 134*  MCV 85.4  --  86.6 85.9  MCH 29.4  --  28.9 29.1  MCHC 34.5  --  33.3 33.9  RDW 13.7  --  13.7 13.7  LYMPHSABS 0.5*  --  0.4* 0.6*  MONOABS 0.6  --  0.2 0.6  EOSABS 0.0  --  0.0 0.0  BASOSABS 0.0  --  0.0 0.0    Chemistries  Recent Labs  Lab 08/05/2019 2156 07/28/2019 2231 07/10/19 1009 07/11/19 0531  NA 136 137 141 142  K 5.0 3.2* 3.3* 3.4*  CL 104  --  107 108  CO2 21*  --  21* 24  GLUCOSE 127*  --  183* 108*  BUN 35*  --  32* 35*  CREATININE 1.12  --  1.00 1.06  CALCIUM 7.8*  --  7.7* 7.7*  MG  --   --  2.7* 2.7*  AST 117*  --  62* 52*  ALT 102*  --  72* 61*  ALKPHOS 99  --  88 84  BILITOT 4.7*  --  1.2 1.0   ------------------------------------------------------------------------------------------------------------------ Recent Labs    08/02/2019 2305  TRIG 119    Lab Results  Component Value Date   HGBA1C 5.7 (H) 02/27/2012   ------------------------------------------------------------------------------------------------------------------ No results for input(s): TSH, T4TOTAL, T3FREE,  THYROIDAB in the last 72 hours.  Invalid input(s): FREET3  Cardiac Enzymes No results for input(s): CKMB, TROPONINI, MYOGLOBIN in the last 168 hours.  Invalid input(s): CK ------------------------------------------------------------------------------------------------------------------ No results found for: BNP  Micro Results Recent Results (from the past 240 hour(s))  Culture, blood (routine x 2)  Status: None (Preliminary result)   Collection Time: 07/31/2019 10:14 PM   Specimen: BLOOD  Result Value Ref Range Status   Specimen Description BLOOD RIGHT ANTECUBITAL  Final   Special Requests   Final    BOTTLES DRAWN AEROBIC AND ANAEROBIC Blood Culture results may not be optimal due to an inadequate volume of blood received in culture bottles   Culture   Final    NO GROWTH 2 DAYS Performed at [facility], [place] [address] [place], [place] [address]    Report Status PENDING  Incomplete  Culture, blood (routine x 2)     Status: None (Preliminary result)   Collection Time: 07/10/19 12:12 AM   Specimen: BLOOD RIGHT FOREARM  Result Value Ref Range Status   Specimen Description BLOOD RIGHT FOREARM  Final   Special Requests   Final    BOTTLES DRAWN AEROBIC AND ANAEROBIC Blood Culture adequate volume   Culture   Final    NO GROWTH 1 DAY Performed at [facility], [place] [address], [place], [place] [address]    Report Status PENDING  Incomplete  Respiratory Panel by RT PCR (Flu A&B, Covid) - Nasopharyngeal Swab     Status: Abnormal   Collection Time: 07/10/19 12:12 AM   Specimen: Nasopharyngeal Swab  Result Value Ref Range Status   SARS Coronavirus 2 by RT PCR POSITIVE (A) NEGATIVE Final    Comment: RESULT CALLED TO, READ BACK BY AND VERIFIED WITH: VENEGAS L, RN AT 0211 ON 07/10/2019 BY SAINVILUS S (NOTE) SARS-CoV-2 target nucleic acids are DETECTED. SARS-CoV-2 RNA is generally detectable in upper respiratory specimens  during the acute phase of infection. Positive  results are indicative of the presence of the identified virus, but do not rule out bacterial infection or co-infection with other pathogens not detected by the test. Clinical correlation with patient history and other diagnostic information is necessary to determine patient infection status. The expected result is Negative. Fact Sheet for Patients:  PinkCheek.be Fact Sheet for Healthcare Providers: GravelBags.it This test is not yet approved or cleared by the [place] FDA and  has been authorized for detection and/or diagnosis of SARS-CoV-2 by FDA under an Emergency Use Authorization (EUA).  This EUA will remain in effect (meaning this test  can be used) for the duration of  the COVID-19 declaration under Section 564(b)(1) of the Act, 21 U.S.C. section 360bbb-3(b)(1), unless the authorization is terminated or revoked sooner.    Influenza A by PCR NEGATIVE NEGATIVE Final   Influenza B by PCR NEGATIVE NEGATIVE Final    Comment: (NOTE) The Xpert Xpress SARS-CoV-2/FLU/RSV assay is intended as an aid in  the diagnosis of influenza from Nasopharyngeal swab specimens and  should not be used as a sole basis for treatment. Nasal washings and  aspirates are unacceptable for Xpert Xpress SARS-CoV-2/FLU/RSV  testing. Fact Sheet for Patients: PinkCheek.be Fact Sheet for Healthcare Providers: GravelBags.it This test is not yet approved or cleared by the [place] FDA and  has been authorized for detection and/or diagnosis of SARS-CoV-2 by  FDA under an Emergency Use Authorization (EUA). This EUA will remain  in effect (meaning this test can be used) for the duration of the  Covid-19 declaration under Section 564(b)(1) of the Act, 21  U.S.C. section 360bbb-3(b)(1), unless the authorization is  terminated or revoked. Performed at [facility], [place] [address]  [place], [place] [address]   Urine culture     Status: None   Collection  Time: 07/10/19  4:18 AM   Specimen: In/Out Cath Urine  Result Value Ref Range Status   Specimen Description IN/OUT CATH URINE  Final   Special Requests NONE  Final   Culture   Final    NO GROWTH Performed at [facility], [address] [place], [place] [address]    Report Status 07/10/2019 FINAL  Final  MRSA PCR Screening     Status: None   Collection Time: 07/10/19  5:27 AM   Specimen: Nasal Mucosa; Nasopharyngeal  Result Value Ref Range Status   MRSA by PCR NEGATIVE NEGATIVE Final    Comment:        The GeneXpert MRSA Assay (FDA approved for NASAL specimens only), is one component of a comprehensive MRSA colonization surveillance program. It is not intended to diagnose MRSA infection nor to guide or monitor treatment for MRSA infections. Performed at [facility], [place] [address] [place], [place] [address]     Radiology Reports CT ANGIO CHEST PE W OR WO CONTRAST  Result Date: 07/10/2019 CLINICAL DATA:  Dyspnea on exertion. Increasing shortness of breath. COVID-19. EXAM: CT ANGIOGRAPHY CHEST WITH CONTRAST TECHNIQUE: Multidetector CT imaging of the chest was performed using the standard protocol during bolus administration of intravenous contrast. Multiplanar CT image reconstructions and MIPs were obtained to evaluate the vascular anatomy. CONTRAST:  75m OMNIPAQUE IOHEXOL 350 MG/ML SOLN COMPARISON:  Chest x-ray dated 07/16/2019 FINDINGS: Cardiovascular: No pulmonary emboli. Heart size is normal. No pericardial effusion. Aortic atherosclerosis. Mediastinum/Nodes: No enlarged mediastinal, hilar, or axillary lymph nodes. Thyroid gland, trachea, and esophagus demonstrate no significant findings. Lungs/Pleura: Extensive bilateral primarily peripheral pulmonary infiltrates most severe in the right upper lobe and in both lower lobes. No effusions. Upper Abdomen: No acute abnormality. 13 mm stone in the lower  mid left kidney, unchanged since the prior CT scan of the abdomen dated 11/15/2010 Musculoskeletal: No acute abnormality. Osteophytes fuse multiple levels in the lower thoracic spine. Review of the MIP images confirms the above findings. IMPRESSION: 1. No pulmonary emboli. 2. Extensive bilateral primarily peripheral pulmonary infiltrates consistent with pneumonia. Electronically Signed   By: JLorriane ShireM.D.   On: 07/10/2019 11:23   DG Chest Port 1 View  Result Date: 07/27/2019 CLINICAL DATA:  Shortness of breath. EXAM: PORTABLE CHEST 1 VIEW COMPARISON:  July 31, 2018 FINDINGS: Moderate to marked severity infiltrates are seen within the bilateral lung bases and along the periphery of the mid to upper right lung. There is no evidence of a pleural effusion or pneumothorax. The heart size and mediastinal contours are within normal limits. Multilevel degenerative changes seen throughout the thoracic spine. IMPRESSION: 1. Moderate to marked severity bilateral infiltrates, right greater than left. Electronically Signed   By: TVirgina NorfolkM.D.   On: 07/21/2019 23:10

## 2019-07-11 NOTE — Consult Note (Signed)
Consultation Note Date: 07/11/2019   Patient Name: [name]  DOB: 1940-06-11  MRN: [id number]  Age / Sex: 79 y.o., male  PCP: Raeanne Gathers, MD Referring Physician: Thurnell Lose, MD  Reason for Consultation: Establishing goals of care  HPI/Patient Profile: 79 y.o. male  with past medical history of anxiety, HLD, arthritis, and neuropathy admitted on 08/04/2019 with shortness of breath and generalized weakness.  Found to have COVID-19, CT with extensive bilateral pulmonary infiltrates. Also found to have d-dimer >20. Was started on cardizem for paroxysmal a fib RVR. CT angio negative for PE. PMT consulted to discuss [place].   Clinical Assessment and Goals of Care: I have reviewed medical records including EPIC notes, labs and imaging, received report from RN, assessed the patient and then met with patient to discuss diagnosis prognosis, GOC, EOL wishes, disposition and options.  I introduced Palliative Medicine as specialized medical care for people living with serious illness. It focuses on providing relief from the symptoms and stress of a serious illness. The goal is to improve quality of life for both the patient and the family.  We discussed a brief life review of the patient. Patient tells me about his history of >30 years in the [employer]. He lives alone. His main source of support is his brother and sister in law. He also tells me his church is a source of support.   As far as functional and nutritional status, [name] tells me he was very independent prior to current illness. He describes himself as a healthy man.      We discussed his current illness and what it means in the larger context of his on-going co-morbidities.  [name] tells me he has a good understanding of his illness.   I attempted to elicit values and goals of care important to the patient.  Mr. Edmonston states "I want to live, I am not ready to die".   The  difference between aggressive medical intervention and comfort care was considered in light of the patient's goals of care.  [name] is interested in any interventions offered to him to prolong life - including CPR and mechanical ventilation. We discussed concerns about his quality of life and ability to recover if he were to need such interventions; however, he continues to state that he would want these interventions since he was so healthy prior to [place].   [name] does share that if he became unable to make medical decisions for himself, he would want his brother to be his decision maker. He also tells me it is okay to call his brother and provide an update - attempted to call but no answer, voicemail left.   Questions and concerns were addressed.  The patient was encouraged to call with questions or concerns.   Primary Decision Maker PATIENT    SUMMARY OF RECOMMENDATIONS   Full code/full scope Brother as HCPOA if patient unable to make decisions  Code Status/Advance Care Planning:  Full code  Prognosis:   Unable to determine  Discharge Planning: To Be Determined      Primary Diagnoses: Present on Admission: . Hypertension . Multifocal pneumonia . Suspected COVID-19 virus infection . Sepsis ([place]) . Acute respiratory failure with hypoxia ([place]) . Atrial fibrillation with RVR ([place]) . ARDS (adult respiratory distress syndrome) ([place])   I have reviewed the medical record, interviewed the patient and family, and examined the patient. The following aspects are pertinent.  Past Medical History:  Diagnosis Date  .  Anxiety   . Arthritis   . Chest pain   . Dizziness   . Dyslipidemia   . Neuropathy   . Other urinary problems    Social History   Socioeconomic History  . Marital status: Single    Spouse name: Not on file  . Number of children: Not on file  . Years of education: Not on file  . Highest education level: Not on file  Occupational History  . Not on  file  Tobacco Use  . Smoking status: Former Research scientist (life sciences)  . Smokeless tobacco: Never Used  Substance and Sexual Activity  . Alcohol use: No  . Drug use: No  . Sexual activity: Not on file  Other Topics Concern  . Not on file  Social History Narrative  . Not on file   Social Determinants of Health   Financial Resource Strain:   . Difficulty of Paying Living Expenses: Not on file  Food Insecurity:   . Worried About Charity fundraiser in the Last Year: Not on file  . Ran Out of Food in the Last Year: Not on file  Transportation Needs:   . Lack of Transportation (Medical): Not on file  . Lack of Transportation (Non-Medical): Not on file  Physical Activity:   . Days of Exercise per Week: Not on file  . Minutes of Exercise per Session: Not on file  Stress:   . Feeling of Stress : Not on file  Social Connections:   . Frequency of Communication with Friends and Family: Not on file  . Frequency of Social Gatherings with Friends and Family: Not on file  . Attends Religious Services: Not on file  . Active Member of Clubs or Organizations: Not on file  . Attends Archivist Meetings: Not on file  . Marital Status: Not on file   Family History  Problem Relation Age of Onset  . Heart disease Mother    Scheduled Meds: . apixaban  5 mg Oral BID  . dexamethasone (DECADRON) injection  6 mg Intravenous Daily  . feeding supplement (ENSURE ENLIVE)  237 mL Oral BID BM  . mouth rinse  15 mL Mouth Rinse BID  . metoprolol tartrate  25 mg Oral BID   Continuous Infusions: . diltiazem (CARDIZEM) infusion 15 mg/hr (07/11/19 0208)  . remdesivir 100 mg in NS 100 mL 100 mg (07/11/19 0934)   PRN Meds:.acetaminophen, lip balm, ondansetron **OR** ondansetron (ZOFRAN) IV, phenol, sodium chloride, white petrolatum Allergies  Allergen Reactions  . Shellfish Allergy Anaphylaxis   Review of Systems  Constitutional: Positive for activity change and fatigue.  Respiratory: Positive for shortness  of breath.   Neurological: Positive for weakness.  Psychiatric/Behavioral: The patient is nervous/anxious.     Physical Exam Constitutional:      Appearance: He is well-developed. He is ill-appearing and diaphoretic.  HENT:     Head: Normocephalic and atraumatic.  Cardiovascular:     Rate and Rhythm: Normal rate.  Pulmonary:     Effort: Tachypnea and accessory muscle usage present.  Musculoskeletal:     Right lower leg: No edema.     Left lower leg: No edema.  Skin:    General: Skin is warm.  Neurological:     Mental Status: He is alert and oriented to person, place, and time.  Psychiatric:        Mood and Affect: Mood is anxious.     Vital Signs: BP 109/74 (BP Location: Right Arm)   Pulse 90  Temp 97.9 F (36.6 C) (Oral)   Resp (!) 21   Ht 6' 1"  (1.854 m)   Wt 95.5 kg   SpO2 90%   BMI 27.78 kg/m  Pain Scale: 0-10 POSS *See Group Information*: S-Acceptable,Sleep, easy to arouse Pain Score: 0-No pain   SpO2: SpO2: 90 % O2 Device:SpO2: 90 % O2 Flow Rate: .O2 Flow Rate (L/min): 11 L/min  IO: Intake/output summary:   Intake/Output Summary (Last 24 hours) at 07/11/2019 1207 Last data filed at 07/10/2019 1600 Gross per 24 hour  Intake 118.25 ml  Output 400 ml  Net -281.75 ml    LBM: Last BM Date: (Patient doesn't remember.) Baseline Weight: Weight: 112.5 kg Most recent weight: Weight: 95.5 kg     Palliative Assessment/Data: PPS 50%    Time Total: 70 minutes Greater than 50%  of this time was spent counseling and coordinating care related to the above assessment and plan.  Juel Burrow, DNP, AGNP-C Palliative Medicine Team [phone] Pager: [phone]

## 2019-07-11 NOTE — Plan of Care (Signed)

## 2019-07-11 NOTE — Progress Notes (Signed)
MD gave order to titrate cardizem drip to 2.5mg /hr, and after 1 hour stop as long as HR remains under 100

## 2019-07-11 NOTE — Progress Notes (Signed)
Virtual Visit via Telephone Note   This visit type was conducted due to national recommendations for restrictions regarding the COVID-19 Pandemic (e.g. social distancing) in an effort to limit this patient's exposure and mitigate transmission in our community.  Due to his co-morbid illnesses, this patient is at least at moderate risk for complications without adequate follow up.  This format is felt to be most appropriate for this patient at this time.  The patient did not have access to video technology/had technical difficulties with video requiring transitioning to audio format only (telephone).  All issues noted in this document were discussed and addressed.  No physical exam could be performed with this format.  Please refer to the patient's chart for his  consent to telehealth for [facility].     Progress Note  Patient Name: [name] Date of Encounter: 07/11/2019  Primary Cardiologist: No primary care provider on file. New  Subjective   Phone visit only today. He feels better and sounds much better.  He is able to speak in lengthy uninterrupted sentences.  Denies dyspnea at rest, but does become short of breath when trying to get out of bed.  Working with physical therapy to get to a chair today. Rhythm has stabilized for the most part over the last 24 hours in sinus rhythm, although he did have brief bursts of atrial fibrillation in the morning while trying to get out of bed. Oxygen saturation has mostly been in the 80s, while at full rest in the low 90s. Still has some oral/throat discomfort.  Inpatient Medications    Scheduled Meds: . apixaban  5 mg Oral BID  . dexamethasone (DECADRON) injection  6 mg Intravenous Daily  . feeding supplement (ENSURE ENLIVE)  237 mL Oral BID BM  . mouth rinse  15 mL Mouth Rinse BID  . metoprolol tartrate  25 mg Oral BID   Continuous Infusions: . diltiazem (CARDIZEM) infusion 15 mg/hr (07/11/19 0208)  . remdesivir 100 mg in NS 100  mL 100 mg (07/11/19 0934)   PRN Meds: acetaminophen, lip balm, ondansetron **OR** ondansetron (ZOFRAN) IV, phenol, sodium chloride, white petrolatum   Vital Signs    Vitals:   07/11/19 0337 07/11/19 0510 07/11/19 0826 07/11/19 0925  BP:  103/62 109/74   Pulse: 72 81 92 90  Resp: (!) 26 (!) 28 (!) 21   Temp:  98.2 F (36.8 C) 97.9 F (36.6 C)   TempSrc:  Axillary Oral   SpO2: 96% 94% 90%   Weight:      Height:        Intake/Output Summary (Last 24 hours) at 07/11/2019 1111 Last data filed at 07/10/2019 1600 Gross per 24 hour  Intake 236.25 ml  Output 400 ml  Net -163.75 ml   Last 3 Weights 07/10/2019 07/23/19 07/31/2018  Weight (lbs) 210 lb 8.6 oz 248 lb 200 lb  Weight (kg) 95.5 kg 112.492 kg 90.719 kg      Telemetry    Mostly sinus rhythm with frequent PACs, occasional bursts of brief paroxysmal atrial fibrillation.- Personally Reviewed  ECG    No new tracing- Personally Reviewed  Physical Exam  Unable to perform.  Voice sounds stronger.   Labs    High Sensitivity Troponin:  No results for input(s): TROPONINIHS in the last 720 hours.    Chemistry Recent Labs  Lab 23-Jul-2019 2156 Jul 23, 2019 2156 Jul 23, 2019 2231 07/10/19 1009 07/11/19 0531  NA 136   < > 137 141 142  K 5.0   < >  3.2* 3.3* 3.4*  CL 104  --   --  107 108  CO2 21*  --   --  21* 24  GLUCOSE 127*  --   --  183* 108*  BUN 35*  --   --  32* 35*  CREATININE 1.12  --   --  1.00 1.06  CALCIUM 7.8*  --   --  7.7* 7.7*  PROT 7.8  --   --  6.8 5.9*  ALBUMIN 2.8*  --   --  2.4* 2.3*  AST 117*  --   --  62* 52*  ALT 102*  --   --  72* 61*  ALKPHOS 99  --   --  88 84  BILITOT 4.7*  --   --  1.2 1.0  GFRNONAA >60  --   --  >60 >60  GFRAA >60  --   --  >60 >60  ANIONGAP 11  --   --  13 10   < > = values in this interval not displayed.     Hematology Recent Labs  Lab 07-Aug-2019 2156 2019-08-07 2156 08/07/19 2231 07/10/19 1009 07/11/19 0531  WBC 11.7*  --   --  9.9 10.4  RBC 5.40  --   --  4.78 4.47    HGB 15.9   < > 14.3 13.8 13.0  HCT 46.1   < > 42.0 41.4 38.4*  MCV 85.4  --   --  86.6 85.9  MCH 29.4  --   --  28.9 29.1  MCHC 34.5  --   --  33.3 33.9  RDW 13.7  --   --  13.7 13.7  PLT PLATELET CLUMPS NOTED ON SMEAR, UNABLE TO ESTIMATE  --   --  135* 134*   < > = values in this interval not displayed.    BNPNo results for input(s): BNP, PROBNP in the last 168 hours.   DDimer  Recent Labs  Lab 07/10/19 0022 07/10/19 1009 07/11/19 0531  DDIMER >20.00* >20.00* >20.00*     Radiology    CT ANGIO CHEST PE W OR WO CONTRAST  Result Date: 07/10/2019 CLINICAL DATA:  Dyspnea on exertion. Increasing shortness of breath. COVID-19. EXAM: CT ANGIOGRAPHY CHEST WITH CONTRAST TECHNIQUE: Multidetector CT imaging of the chest was performed using the standard protocol during bolus administration of intravenous contrast. Multiplanar CT image reconstructions and MIPs were obtained to evaluate the vascular anatomy. CONTRAST:  48mL OMNIPAQUE IOHEXOL 350 MG/ML SOLN COMPARISON:  Chest x-ray dated 07-Aug-2019 FINDINGS: Cardiovascular: No pulmonary emboli. Heart size is normal. No pericardial effusion. Aortic atherosclerosis. Mediastinum/Nodes: No enlarged mediastinal, hilar, or axillary lymph nodes. Thyroid gland, trachea, and esophagus demonstrate no significant findings. Lungs/Pleura: Extensive bilateral primarily peripheral pulmonary infiltrates most severe in the right upper lobe and in both lower lobes. No effusions. Upper Abdomen: No acute abnormality. 13 mm stone in the lower mid left kidney, unchanged since the prior CT scan of the abdomen dated 11/15/2010 Musculoskeletal: No acute abnormality. Osteophytes fuse multiple levels in the lower thoracic spine. Review of the MIP images confirms the above findings. IMPRESSION: 1. No pulmonary emboli. 2. Extensive bilateral primarily peripheral pulmonary infiltrates consistent with pneumonia. Electronically Signed   By: Lorriane Shire M.D.   On: 07/10/2019 11:23    DG Chest Port 1 View  Result Date: 08/07/19 CLINICAL DATA:  Shortness of breath. EXAM: PORTABLE CHEST 1 VIEW COMPARISON:  July 31, 2018 FINDINGS: Moderate to marked severity infiltrates are seen within the bilateral lung  bases and along the periphery of the mid to upper right lung. There is no evidence of a pleural effusion or pneumothorax. The heart size and mediastinal contours are within normal limits. Multilevel degenerative changes seen throughout the thoracic spine. IMPRESSION: 1. Moderate to marked severity bilateral infiltrates, right greater than left. Electronically Signed   By: Aram Candela M.D.   On: 07/11/2019 23:10    Cardiac Studies   2013 echo - Left ventricle: The cavity size was normal. Wall thickness  was normal. Systolic function was normal. The estimated  ejection fraction was in the range of 55% to 60%.  - Mitral valve: Mild regurgitation.  - Atrial septum: No defect or patent foramen ovale was  identified. 2013 normal perfusion on nuclear stress test   Patient Profile     79 y.o. male with recurrent episodes of paroxysmal atrial fibrillation rapid ventricular response during COVID-19 infection with severe bilateral pneumonia and hypoxia.  Assessment & Plan    Still has occasional atrial fibrillation.  Rate reasonably controlled on beta-blocker. Reevaluate need for long-term anticoagulation at follow-up visit 1 month after discharge. We will also repeat an echocardiogram at that time primarily to assess atrial size and estimate risk of future arrhythmia recurrence.     For questions or updates, please contact [facility] Please consult [email or web address] for contact info under        Signed, Thurmon Fair, MD  07/11/2019, 11:11 AM

## 2019-07-11 NOTE — Progress Notes (Signed)
Attempted to call [name] (brother) x2

## 2019-07-11 NOTE — Progress Notes (Signed)
ANTICOAGULATION CONSULT NOTE  Pharmacy Consult for Heparin - > apixaban -> to Lovenox Indication: atrial fibrillation / COVID / increased d - dimer  Allergies  Allergen Reactions  . Shellfish Allergy Anaphylaxis    Patient Measurements: Height: 6\' 1"  (185.4 cm) Weight: 210 lb 8.6 oz (95.5 kg) IBW/kg (Calculated) : 79.9  Vital Signs: Temp: 98.2 F (36.8 C) (02/03 1246) Temp Source: Oral (02/03 1246) BP: 124/76 (02/03 1246) Pulse Rate: 78 (02/03 1246)  Labs: Recent Labs    08/05/2019 2156 07/28/2019 2156 07/24/2019 2231 07/25/2019 2231 07/10/19 0022 07/10/19 1009 07/11/19 0531  HGB 15.9   < > 14.3   < >  --  13.8 13.0  HCT 46.1   < > 42.0  --   --  41.4 38.4*  PLT PLATELET CLUMPS NOTED ON SMEAR, UNABLE TO ESTIMATE  --   --   --   --  135* 134*  APTT  --   --   --   --  30  --   --   LABPROT  --   --   --   --  18.6*  --   --   INR  --   --   --   --  1.6*  --   --   HEPARINUNFRC  --   --   --   --   --  0.26*  --   CREATININE 1.12  --   --   --   --  1.00 1.06   < > = values in this interval not displayed.    Estimated Creatinine Clearance: 66 mL/min (by C-G formula based on SCr of 1.06 mg/dL).   Medical History: Past Medical History:  Diagnosis Date  . Anxiety   . Arthritis   . Chest pain   . Dizziness   . Dyslipidemia   . Neuropathy   . Other urinary problems      Assessment: 79 y/o M with atrial fibrillation, elevated D -dimer, and Covid To transition from apixaban to Lovenox Last dose of apixaban this AM   Goal of Therapy:   Monitor platelets by anticoagulation protocol: Yes   Plan:  Lovenox 95 mg sq Q 12 hours starting at 9 pm Watch CBC Monitor for bleeding  Thank you 62, PharmD

## 2019-07-12 ENCOUNTER — Inpatient Hospital Stay (HOSPITAL_COMMUNITY): Payer: Medicare Other

## 2019-07-12 DIAGNOSIS — R7989 Other specified abnormal findings of blood chemistry: Secondary | ICD-10-CM

## 2019-07-12 LAB — COMPREHENSIVE METABOLIC PANEL
ALT: 68 U/L — ABNORMAL HIGH (ref 0–44)
AST: 54 U/L — ABNORMAL HIGH (ref 15–41)
Albumin: 2.8 g/dL — ABNORMAL LOW (ref 3.5–5.0)
Alkaline Phosphatase: 101 U/L (ref 38–126)
Anion gap: 16 — ABNORMAL HIGH (ref 5–15)
BUN: 40 mg/dL — ABNORMAL HIGH (ref 8–23)
CO2: 22 mmol/L (ref 22–32)
Calcium: 8.2 mg/dL — ABNORMAL LOW (ref 8.9–10.3)
Chloride: 102 mmol/L (ref 98–111)
Creatinine, Ser: 0.91 mg/dL (ref 0.61–1.24)
GFR calc Af Amer: >60 mL/min (ref >60)
GFR calc non Af Amer: >60 mL/min (ref >60)
Glucose, Bld: 142 mg/dL — ABNORMAL HIGH (ref 70–99)
Potassium: 3.9 mmol/L (ref 3.5–5.1)
Sodium: 140 mmol/L (ref 135–145)
Total Bilirubin: 1.5 mg/dL — ABNORMAL HIGH (ref 0.3–1.2)
Total Protein: 7.2 g/dL (ref 6.5–8.1)

## 2019-07-12 LAB — CBC WITH DIFFERENTIAL/PLATELET
Abs Immature Granulocytes: 0.13 10*3/uL — ABNORMAL HIGH (ref 0.00–0.07)
Basophils Absolute: 0 10*3/uL (ref 0.0–0.1)
Basophils Relative: 0 %
Eosinophils Absolute: 0 10*3/uL (ref 0.0–0.5)
Eosinophils Relative: 0 %
HCT: 41.4 % (ref 39.0–52.0)
Hemoglobin: 14.1 g/dL (ref 13.0–17.0)
Immature Granulocytes: 1 %
Lymphocytes Relative: 5 %
Lymphs Abs: 0.6 10*3/uL — ABNORMAL LOW (ref 0.7–4.0)
MCH: 29.3 pg (ref 26.0–34.0)
MCHC: 34.1 g/dL (ref 30.0–36.0)
MCV: 85.9 fL (ref 80.0–100.0)
Monocytes Absolute: 0.4 10*3/uL (ref 0.1–1.0)
Monocytes Relative: 3 %
Neutro Abs: 10.3 10*3/uL — ABNORMAL HIGH (ref 1.7–7.7)
Neutrophils Relative %: 91 %
Platelets: 128 10*3/uL — ABNORMAL LOW (ref 150–400)
RBC: 4.82 MIL/uL (ref 4.22–5.81)
RDW: 13.7 % (ref 11.5–15.5)
WBC: 11.4 10*3/uL — ABNORMAL HIGH (ref 4.0–10.5)
nRBC: 0 % (ref 0.0–0.2)

## 2019-07-12 LAB — T4, FREE: Free T4: 1.33 ng/dL — ABNORMAL HIGH (ref 0.61–1.12)

## 2019-07-12 LAB — MAGNESIUM: Magnesium: 2.4 mg/dL (ref 1.7–2.4)

## 2019-07-12 LAB — BRAIN NATRIURETIC PEPTIDE: B Natriuretic Peptide: 94.9 pg/mL (ref 0.0–100.0)

## 2019-07-12 LAB — PROCALCITONIN: Procalcitonin: 0.1 ng/mL

## 2019-07-12 LAB — TSH: TSH: 0.298 u[IU]/mL — ABNORMAL LOW (ref 0.350–4.500)

## 2019-07-12 LAB — C-REACTIVE PROTEIN: CRP: 7.6 mg/dL — ABNORMAL HIGH (ref <1.0)

## 2019-07-12 LAB — D-DIMER, QUANTITATIVE: D-Dimer, Quant: >20 ug/mL-FEU — ABNORMAL HIGH (ref 0.00–0.50)

## 2019-07-12 MED ORDER — METHYLPREDNISOLONE SODIUM SUCC 40 MG IJ SOLR
40.0000 mg | INTRAMUSCULAR | Status: DC
  Administered 2019-07-12: 40 mg via INTRAVENOUS
  Filled 2019-07-12: qty 1

## 2019-07-12 MED ORDER — METOPROLOL TARTRATE 50 MG PO TABS: 75.0000 mg | ORAL_TABLET | Freq: Two times a day (BID) | ORAL | Status: DC

## 2019-07-12 MED ORDER — FUROSEMIDE 10 MG/ML IJ SOLN
40.0000 mg | Freq: Once | INTRAMUSCULAR | Status: AC
  Administered 2019-07-12: 40 mg via INTRAVENOUS
  Filled 2019-07-12: qty 4

## 2019-07-12 MED ORDER — HALOPERIDOL LACTATE 5 MG/ML IJ SOLN
2.0000 mg | Freq: Four times a day (QID) | INTRAMUSCULAR | Status: DC | PRN
  Administered 2019-07-12 – 2019-07-14 (×4): 2 mg via INTRAVENOUS
  Filled 2019-07-12 (×4): qty 1

## 2019-07-12 MED ORDER — METOPROLOL TARTRATE 100 MG PO TABS
100.0000 mg | ORAL_TABLET | Freq: Two times a day (BID) | ORAL | Status: DC
  Administered 2019-07-12 – 2019-07-14 (×3): 100 mg via ORAL
  Filled 2019-07-12 (×4): qty 1

## 2019-07-12 MED ORDER — GUAIFENESIN-DM 100-10 MG/5ML PO SYRP
5.0000 mL | ORAL_SOLUTION | ORAL | Status: DC | PRN
  Administered 2019-07-12 (×2): 5 mL via ORAL
  Filled 2019-07-12 (×2): qty 5

## 2019-07-12 NOTE — Progress Notes (Signed)
Progress Note  This visit type was conducted due to national recommendations for restrictions regarding the COVID-19 Pandemic (e.g. social distancing) in an effort to limit this patient's exposure and mitigate transmission in our community.  Due to his co-morbid illnesses, this patient is at least at moderate risk for complications without adequate follow up.  This format is felt to be most appropriate for this patient at this time.  The patient did not have access to video technology/had technical difficulties with video requiring transitioning to audio format only (telephone).  All issues noted in this document were discussed and addressed.  No physical exam could be performed with this format.  Please refer to the patient's chart for his  consent to telehealth for [facility].   Patient Name: [name] Date of Encounter: 07/12/2019  Primary Cardiologist: No primary care provider on file.   Subjective   He is a little agitated. Believes that he's been kidnapped. Otherwise, seems to be breathing a little better. Increased AF burden on telemetry, but with fair ventricular rate control.  Inpatient Medications    Scheduled Meds: . diltiazem  60 mg Oral Q8H  . doxycycline  100 mg Oral Q12H  . enoxaparin (LOVENOX) injection  95 mg Subcutaneous Q12H  . feeding supplement (ENSURE ENLIVE)  237 mL Oral BID BM  . mouth rinse  15 mL Mouth Rinse BID  . methylPREDNISolone (SOLU-MEDROL) injection  60 mg Intravenous Q24H  . metoprolol tartrate  75 mg Oral BID   Continuous Infusions: . remdesivir 100 mg in NS 100 mL 100 mg (07/12/19 0831)   PRN Meds: acetaminophen, guaiFENesin-dextromethorphan, lip balm, [DISCONTINUED] ondansetron **OR** ondansetron (ZOFRAN) IV, phenol, sodium chloride, white petrolatum   Vital Signs    Vitals:   07/12/19 0820 07/12/19 0824 07/12/19 0944 07/12/19 0956  BP: (!) 141/89     Pulse: 93 92    Resp:      Temp: (!) 97.5 F (36.4 C)     TempSrc: Oral       SpO2: (!) 89% 93% 96% 94%  Weight:      Height:        Intake/Output Summary (Last 24 hours) at 07/12/2019 1106 Last data filed at 07/12/2019 0554 Gross per 24 hour  Intake 1524.88 ml  Output 1150 ml  Net 374.88 ml   Last 3 Weights 07/10/2019 08-03-2019 07/31/2018  Weight (lbs) 210 lb 8.6 oz 248 lb 200 lb  Weight (kg) 95.5 kg 112.492 kg 90.719 kg      Telemetry    Increased frequency of AFib episodes, but adequate rate control - Personally Reviewed  ECG    No new tracing - Personally Reviewed  Physical Exam  Anxious, upset Phone visit Labs    High Sensitivity Troponin:  No results for input(s): TROPONINIHS in the last 720 hours.    Chemistry Recent Labs  Lab 07/10/19 1009 07/11/19 0531 07/12/19 0358  NA 141 142 140  K 3.3* 3.4* 3.9  CL 107 108 102  CO2 21* 24 22  GLUCOSE 183* 108* 142*  BUN 32* 35* 40*  CREATININE 1.00 1.06 0.91  CALCIUM 7.7* 7.7* 8.2*  PROT 6.8 5.9* 7.2  ALBUMIN 2.4* 2.3* 2.8*  AST 62* 52* 54*  ALT 72* 61* 68*  ALKPHOS 88 84 101  BILITOT 1.2 1.0 1.5*  GFRNONAA >60 >60 >60  GFRAA >60 >60 >60  ANIONGAP 13 10 16*     Hematology Recent Labs  Lab 07/10/19 1009 07/11/19 0531 07/12/19 0358  WBC 9.9 10.4 11.4*  RBC 4.78 4.47 4.82  HGB 13.8 13.0 14.1  HCT 41.4 38.4* 41.4  MCV 86.6 85.9 85.9  MCH 28.9 29.1 29.3  MCHC 33.3 33.9 34.1  RDW 13.7 13.7 13.7  PLT 135* 134* 128*    BNP Recent Labs  Lab 07/11/19 0531 07/12/19 0358  BNP 88.7 94.9     DDimer  Recent Labs  Lab 07/10/19 1009 07/11/19 0531 07/12/19 0358  DDIMER >20.00* >20.00* >20.00*     Radiology    CT ANGIO CHEST PE W OR WO CONTRAST  Result Date: 07/10/2019 CLINICAL DATA:  Dyspnea on exertion. Increasing shortness of breath. COVID-19. EXAM: CT ANGIOGRAPHY CHEST WITH CONTRAST TECHNIQUE: Multidetector CT imaging of the chest was performed using the standard protocol during bolus administration of intravenous contrast. Multiplanar CT image reconstructions and MIPs were  obtained to evaluate the vascular anatomy. CONTRAST:  68mL OMNIPAQUE IOHEXOL 350 MG/ML SOLN COMPARISON:  Chest x-ray dated 08/02/2019 FINDINGS: Cardiovascular: No pulmonary emboli. Heart size is normal. No pericardial effusion. Aortic atherosclerosis. Mediastinum/Nodes: No enlarged mediastinal, hilar, or axillary lymph nodes. Thyroid gland, trachea, and esophagus demonstrate no significant findings. Lungs/Pleura: Extensive bilateral primarily peripheral pulmonary infiltrates most severe in the right upper lobe and in both lower lobes. No effusions. Upper Abdomen: No acute abnormality. 13 mm stone in the lower mid left kidney, unchanged since the prior CT scan of the abdomen dated 11/15/2010 Musculoskeletal: No acute abnormality. Osteophytes fuse multiple levels in the lower thoracic spine. Review of the MIP images confirms the above findings. IMPRESSION: 1. No pulmonary emboli. 2. Extensive bilateral primarily peripheral pulmonary infiltrates consistent with pneumonia. Electronically Signed   By: Lorriane Shire M.D.   On: 07/10/2019 11:23    Cardiac Studies   2013 echo - Left ventricle: The cavity size was normal. Wall thickness  was normal. Systolic function was normal. The estimated  ejection fraction was in the range of 55% to 60%.  - Mitral valve: Mild regurgitation.  - Atrial septum: No defect or patent foramen ovale was  identified. 2013 normal perfusion on nuclear stress test   Patient Profile     79 y.o. male with recurrent episodes of paroxysmal atrial fibrillation rapid ventricular response during COVID-19 infection with severe bilateral pneumonia and hypoxia.  Assessment & Plan    Increasing AFib episodes. Seems improved respiratory wise, but is agitated and may be a little delirious (acute illness? steroids?).  Will increase the beta blocker dose slightly.      For questions or updates, please contact [place] Please consult [email or web address] for contact info under         Signed, Sanda Klein, MD  07/12/2019, 11:06 AM

## 2019-07-12 NOTE — Progress Notes (Signed)
Physical Therapy Treatment Patient Details Name: MAHMOOD BOEHRINGER MRN: [id number] DOB: August 15, 1940 Today's Date: 07/12/2019    History of Present Illness 79 y.o. male with medical history significant of Anxiety, HLD, neuropathy presented to ED 07/10/2019 for  difficulty breathing and generalized weakness for the last 4 weeks. Found + afib, fever, RR 40s, sats 70s on RA. + COVID with pna. CT angio chest negative for PE    PT Comments    Patient very impulsive during session and moving too quickly (while talking!) with sats dropping to 77% on 15L HFNC. Required 2 person assist for safety due to lines and pt's quick movements with poor safety awareness.     Follow Up Recommendations  No PT follow up;Supervision - Intermittent(if slower progress, may need SNF on discharge)     Equipment Recommendations  None recommended by PT    Recommendations for Other Services       Precautions / Restrictions Precautions Precautions: Fall;Other (comment) Precaution Comments: desaturates easily Restrictions Weight Bearing Restrictions: No    Mobility  Bed Mobility Overal bed mobility: Needs Assistance Bed Mobility: Supine to Sit     Supine to sit: Min guard     General bed mobility comments: Min Guard A for safety  Transfers Overall transfer level: Needs assistance Equipment used: None Transfers: Sit to/from [employer] Sit to Stand: Min guard         General transfer comment: Min Guard A for safety. Poor safety awareness and requiring cues throughout  Ambulation/Gait Ambulation/Gait assistance: Min assist Gait Distance (Feet): 3 Feet Assistive device: None       General Gait Details: patient began walking to chair despite cues to stop/wait/sit back down. He began stepping over the monitor lines and his condom catheter with LOB and assist to recover balance.    Stairs             Wheelchair Mobility    Modified Rankin (Stroke Patients Only)        Balance Overall balance assessment: Mild deficits observed, not formally tested                                          Cognition Arousal/Alertness: Awake/alert Behavior During Therapy: Restless;Impulsive Overall Cognitive Status: Impaired/Different from baseline Area of Impairment: Memory;Attention;Following commands;Safety/judgement;Awareness;Problem solving                   Current Attention Level: Sustained Memory: Decreased short-term memory Following Commands: Follows one step commands inconsistently;Follows one step commands with increased time Safety/Judgement: Decreased awareness of safety;Decreased awareness of deficits Awareness: Intellectual;Emergent Problem Solving: Slow processing;Requires verbal cues;Difficulty sequencing General Comments: Pt highly distractable and requiring Max cues for safety throughout. Poor safety awareness.      Exercises Other Exercises Other Exercises: Reviewed flutter valve and IS. Pt verablized understanding    General Comments General comments (skin integrity, edema, etc.): Sats down to 77% on 15L HFNC during transfer/short walk bed to chair. Continues to try to talk while cued to stop talking and focus on breathing. Required direct eye contact and authoritative tone to get him to stop talking and use pursed lip breathing to recover. Recovered to 88% in <10 minutes      Pertinent Vitals/Pain Pain Assessment: Faces Faces Pain Scale: No hurt Pain Intervention(s): Monitored during session    Home Living Family/patient expects to be discharged to:: Private residence Living  Arrangements: Alone Available Help at Discharge: Family Type of Home: Other(Comment)( lives in extended care hotel) Home Access: Stairs to enter   Home Layout: One level Home Equipment: Grab bars - tub/shower Additional Comments: has brother and sister-in-law live nearby and they have been doing his grocery shopping for him since [place]; he  drives (last time was to dentist--where he insists he got COVID)    Prior Function Level of Independence: Independent          PT Goals (current goals can now be found in the care plan section) Acute Rehab PT Goals Patient Stated Goal: get rid of all these lines and tubes Time For Goal Achievement: 07/25/19 Potential to Achieve Goals: Good Progress towards PT goals: Progressing toward goals    Frequency    Min 3X/week      PT Plan Current plan remains appropriate    Co-evaluation PT/OT/SLP Co-Evaluation/Treatment: Yes Reason for Co-Treatment: Complexity of the patient's impairments (multi-system involvement);For patient/therapist safety;To address functional/ADL transfers   OT goals addressed during session: ADL's and self-care      AM-PAC PT "6 Clicks" Mobility   Outcome Measure  Help needed turning from your back to your side while in a flat bed without using bedrails?: None Help needed moving from lying on your back to sitting on the side of a flat bed without using bedrails?: A Little Help needed moving to and from a bed to a chair (including a wheelchair)?: A Little Help needed standing up from a chair using your arms (e.g., wheelchair or bedside chair)?: A Little Help needed to walk in hospital room?: Total Help needed climbing 3-5 steps with a railing? : Total 6 Click Score: 15    End of Session Equipment Utilized During Treatment: Oxygen Activity Tolerance: Treatment limited secondary to medical complications (Comment) Patient left: in chair;with call bell/phone within reach   PT Visit Diagnosis: Difficulty in walking, not elsewhere classified (R26.2);Muscle weakness (generalized) (M62.81)     Time: 3875-6433 PT Time Calculation (min) (ACUTE ONLY): 21 min  Charges:  $Therapeutic Activity: 8-22 mins                      Arby Barrette, PT Pager [phone]    Rexanne Mano 07/12/2019, 5:07 PM

## 2019-07-12 NOTE — Progress Notes (Signed)
PROGRESS NOTE                                                                                                                                                                                                             Patient Demographics:    [name], is a 79 y.o. male, DOB - 17-Oct-1940, QDI:[id number]  Outpatient Primary MD for the patient is Raeanne Gathers, MD    LOS - 2  Admit date - 08/04/2019    Chief Complaint  Patient presents with  . Shortness of Breath       Brief Narrative  [name] is a 79 y.o. male with medical history significant of Anxiety, HLD.  Pt presented to ED with SOB.   He was apparently sick for 3 to 4 days before seeking medical attention, upon arrival to the ER his work-up suggested acute hypoxic respiratory failure due to COVID-19 pneumonia, he was also found to have new onset A. fib and RVR.    Subjective:  Patient in bed, appears comfortable, denies any headache, no fever, no chest pain or pressure, improved shortness of breath , no abdominal pain. No focal weakness.  Appears mildly confused.    Assessment  & Plan :     1. Acute Hypoxic Resp. Failure due to Acute Covid 19 Viral Pneumonitis during the ongoing 2020 Covid 19 Pandemic - he has severe disease, currently on 12 L high flow oxygen, in moderate to severe respiratory distress, extremely elevated CRP and D-dimer despite steroids Eliquis.  At this time I will increase his IV steroids, continue remdesivir, after informed consent he already received Actemra on 07/10/2019.  Continue to monitor clinically, overall condition is tenuous if he declines further he will be an extremely poor candidate for mechanical ventilation or intubation, he would benefit from comfort measures at that point.  Tried contacting his brother but went to answering machine.  Will try again tomorrow.  Encouraged the patient to sit up in chair in the daytime use  I-S and flutter valve for pulmonary toiletry and then prone in bed when at night.    SpO2: 92 % O2 Flow Rate (L/min): 13 L/min FiO2 (%): 95 %  Recent Labs  Lab 07/14/2019 2305 07/10/19 0012 07/10/19 0022 07/10/19 1009 07/11/19 0531 07/12/19 0358  CRP 24.0*  --   --  21.1*  13.6* 7.6*  DDIMER  --   --  >20.00* >20.00* >20.00* >20.00*  FERRITIN  --   --  746* 806* 822*  --   BNP  --   --   --   --  88.7 94.9  PROCALCITON  --   --  0.24  --  0.19 0.10  SARSCOV2NAA  --  POSITIVE*  --   --   --   --     Hepatic Function Latest Ref Rng & Units 07/12/2019 07/11/2019 07/10/2019  Total Protein 6.5 - 8.1 g/dL 7.2 5.9(L) 6.8  Albumin 3.5 - 5.0 g/dL 2.8(L) 2.3(L) 2.4(L)  AST 15 - 41 U/L 54(H) 52(H) 62(H)  ALT 0 - 44 U/L 68(H) 61(H) 72(H)  Alk Phosphatase 38 - 126 U/L 101 84 88  Total Bilirubin 0.3 - 1.2 mg/dL 1.5(H) 1.0 1.2     2.  New onset A. fib RVR [place] vas 2 score of at least 2.  Rate in good control, will place him on oral Cardizem and beta-blocker and titrate off his Cardizem drip.  Full anticoagulation.     Lab Results  Component Value Date   TSH 0.287 (L) 07/11/2019     3.  Extremely elevated D-dimer.  Highly suspicious for a blood clot, stop Eliquis and place him on Lovenox, CT angiogram chest negative but will check lower extremity venous duplex.  4.  Hypertension.  Currently on Cardizem and beta-blocker will monitor and adjust as needed.  5.  Mildly suppressed TSH.  Could be sick euthyroid syndrome, will repeat TSH along with free T4 and T3.    Condition - Extremely Guarded  Family Communication  :   Left message for patient's brother in detail on 07/11/2019 about poor prognosis, left message again for patient's brother on 07/12/2019 at 11:50 AM  Code Status : For now full code but extremely poor candidate for intubation and mechanical ventilation.  Diet :    Diet Order            Diet full liquid Room service appropriate? Yes; Fluid consistency: Thin  Diet effective now                Disposition Plan  : Severely hypoxic on 15 L of oxygen, on full dose Lovenox and full Covid treatment.  Consults  : Cardiology  Procedures  :    CT angiogram.  No PE  Venous ultrasound legs.  PUD Prophylaxis :  None  DVT Prophylaxis  :  Lovenox    Lab Results  Component Value Date   PLT 128 (L) 07/12/2019    Inpatient Medications  Scheduled Meds: . diltiazem  60 mg Oral Q8H  . doxycycline  100 mg Oral Q12H  . enoxaparin (LOVENOX) injection  95 mg Subcutaneous Q12H  . feeding supplement (ENSURE ENLIVE)  237 mL Oral BID BM  . mouth rinse  15 mL Mouth Rinse BID  . methylPREDNISolone (SOLU-MEDROL) injection  60 mg Intravenous Q24H  . metoprolol tartrate  100 mg Oral BID   Continuous Infusions: . remdesivir 100 mg in NS 100 mL 100 mg (07/12/19 0831)   PRN Meds:.acetaminophen, guaiFENesin-dextromethorphan, lip balm, [DISCONTINUED] ondansetron **OR** ondansetron (ZOFRAN) IV, phenol, sodium chloride, white petrolatum  Antibiotics  :    Anti-infectives (From admission, onward)   Start     Dose/Rate Route Frequency Ordered Stop   07/11/19 1345  doxycycline (VIBRA-TABS) tablet 100 mg     100 mg Oral Every 12 hours 07/11/19 1344  07/11/19 1000  remdesivir 100 mg in sodium chloride 0.9 % 100 mL IVPB     100 mg 200 mL/hr over 30 Minutes Intravenous Daily 07/10/19 0223 07/15/19 0959   07/10/19 0300  remdesivir 200 mg in sodium chloride 0.9% 250 mL IVPB     200 mg 580 mL/hr over 30 Minutes Intravenous Once 07/10/19 0223 07/10/19 0551   07/18/2019 2345  cefTRIAXone (ROCEPHIN) 2 g in sodium chloride 0.9 % 100 mL IVPB  Status:  Discontinued     2 g 200 mL/hr over 30 Minutes Intravenous Every 24 hours 07/22/2019 2337 07/10/19 0222   07/28/2019 2345  azithromycin (ZITHROMAX) 500 mg in sodium chloride 0.9 % 250 mL IVPB  Status:  Discontinued     500 mg 250 mL/hr over 60 Minutes Intravenous Every 24 hours 07/22/2019 2337 07/10/19 0222       Time Spent in minutes   30   Lala Lund M.D on 07/12/2019 at 11:51 AM  To page go to [email or web address] - password [facility]  [facility] -  Office  [phone]    See all Orders from today for further details    Objective:   Vitals:   07/12/19 0824 07/12/19 0944 07/12/19 0956 07/12/19 1100  BP:      Pulse: 92     Resp:      Temp:      TempSrc:      SpO2: 93% 96% 94% 92%  Weight:      Height:        Wt Readings from Last 3 Encounters:  07/10/19 95.5 kg  07/31/18 90.7 kg  10/17/12 102.1 kg     Intake/Output Summary (Last 24 hours) at 07/12/2019 1151 Last data filed at 07/12/2019 0554 Gross per 24 hour  Intake 1524.88 ml  Output 1150 ml  Net 374.88 ml     Physical Exam  Awake mildly confused, no new F.N deficits,   [place].AT,PERRAL Supple Neck,No JVD, No cervical lymphadenopathy appriciated.  Symmetrical Chest wall movement, Good air movement bilaterally, CTAB iRRR,No Gallops, Rubs or new Murmurs, No Parasternal Heave +ve B.Sounds, Abd Soft, No tenderness, No organomegaly appriciated, No rebound - guarding or rigidity. No Cyanosis, Clubbing or edema,     Data Review:    CBC Recent Labs  Lab 07/23/2019 2156 08/04/2019 2231 07/10/19 1009 07/11/19 0531 07/12/19 0358  WBC 11.7*  --  9.9 10.4 11.4*  HGB 15.9 14.3 13.8 13.0 14.1  HCT 46.1 42.0 41.4 38.4* 41.4  PLT PLATELET CLUMPS NOTED ON SMEAR, UNABLE TO ESTIMATE  --  135* 134* 128*  MCV 85.4  --  86.6 85.9 85.9  MCH 29.4  --  28.9 29.1 29.3  MCHC 34.5  --  33.3 33.9 34.1  RDW 13.7  --  13.7 13.7 13.7  LYMPHSABS 0.5*  --  0.4* 0.6* 0.6*  MONOABS 0.6  --  0.2 0.6 0.4  EOSABS 0.0  --  0.0 0.0 0.0  BASOSABS 0.0  --  0.0 0.0 0.0    Chemistries  Recent Labs  Lab 07/12/2019 2156 07/15/2019 2231 07/10/19 1009 07/11/19 0531 07/12/19 0358  NA 136 137 141 142 140  K 5.0 3.2* 3.3* 3.4* 3.9  CL 104  --  107 108 102  CO2 21*  --  21* 24 22  GLUCOSE 127*  --  183* 108* 142*  BUN 35*  --  32* 35* 40*  CREATININE 1.12  --  1.00 1.06  0.91  CALCIUM 7.8*  --  7.7* 7.7* 8.2*  MG  --   --  2.7* 2.7* 2.4  AST 117*  --  62* 52* 54*  ALT 102*  --  72* 61* 68*  ALKPHOS 99  --  88 84 101  BILITOT 4.7*  --  1.2 1.0 1.5*   ------------------------------------------------------------------------------------------------------------------ Recent Labs    08/02/2019 2305  TRIG 119    Lab Results  Component Value Date   HGBA1C 5.7 (H) 02/27/2012   ------------------------------------------------------------------------------------------------------------------ Recent Labs    07/11/19 0531  TSH 0.287*    Cardiac Enzymes No results for input(s): CKMB, TROPONINI, MYOGLOBIN in the last 168 hours.  Invalid input(s): CK ------------------------------------------------------------------------------------------------------------------    Component Value Date/Time   BNP 94.9 07/12/2019 0358    Micro Results Recent Results (from the past 240 hour(s))  Culture, blood (routine x 2)     Status: None (Preliminary result)   Collection Time: 07/11/2019 10:14 PM   Specimen: BLOOD  Result Value Ref Range Status   Specimen Description BLOOD RIGHT ANTECUBITAL  Final   Special Requests   Final    BOTTLES DRAWN AEROBIC AND ANAEROBIC Blood Culture results may not be optimal due to an inadequate volume of blood received in culture bottles   Culture   Final    NO GROWTH 3 DAYS Performed at [facility], [place] [address], [place], [place] [address]    Report Status PENDING  Incomplete  Culture, blood (routine x 2)     Status: None (Preliminary result)   Collection Time: 07/10/19 12:12 AM   Specimen: BLOOD RIGHT FOREARM  Result Value Ref Range Status   Specimen Description BLOOD RIGHT FOREARM  Final   Special Requests   Final    BOTTLES DRAWN AEROBIC AND ANAEROBIC Blood Culture adequate volume   Culture   Final    NO GROWTH 2 DAYS Performed at [facility], [place] [address], [place], [place] [address]    Report Status  PENDING  Incomplete  Respiratory Panel by RT PCR (Flu A&B, Covid) - Nasopharyngeal Swab     Status: Abnormal   Collection Time: 07/10/19 12:12 AM   Specimen: Nasopharyngeal Swab  Result Value Ref Range Status   SARS Coronavirus 2 by RT PCR POSITIVE (A) NEGATIVE Final    Comment: RESULT CALLED TO, READ BACK BY AND VERIFIED WITH: VENEGAS L, RN AT 0211 ON 07/10/2019 BY SAINVILUS S (NOTE) SARS-CoV-2 target nucleic acids are DETECTED. SARS-CoV-2 RNA is generally detectable in upper respiratory specimens  during the acute phase of infection. Positive results are indicative of the presence of the identified virus, but do not rule out bacterial infection or co-infection with other pathogens not detected by the test. Clinical correlation with patient history and other diagnostic information is necessary to determine patient infection status. The expected result is Negative. Fact Sheet for Patients:  PinkCheek.be Fact Sheet for Healthcare Providers: GravelBags.it This test is not yet approved or cleared by the [place] FDA and  has been authorized for detection and/or diagnosis of SARS-CoV-2 by FDA under an Emergency Use Authorization (EUA).  This EUA will remain in effect (meaning this test  can be used) for the duration of  the COVID-19 declaration under Section 564(b)(1) of the Act, 21 U.S.C. section 360bbb-3(b)(1), unless the authorization is terminated or revoked sooner.    Influenza A by PCR NEGATIVE NEGATIVE Final   Influenza B by PCR NEGATIVE NEGATIVE Final    Comment: (NOTE) The Xpert Xpress SARS-CoV-2/FLU/RSV assay is intended as an aid in  the  diagnosis of influenza from Nasopharyngeal swab specimens and  should not be used as a sole basis for treatment. Nasal washings and  aspirates are unacceptable for Xpert Xpress SARS-CoV-2/FLU/RSV  testing. Fact Sheet for Patients: PinkCheek.be Fact  Sheet for Healthcare Providers: GravelBags.it This test is not yet approved or cleared by the [place] FDA and  has been authorized for detection and/or diagnosis of SARS-CoV-2 by  FDA under an Emergency Use Authorization (EUA). This EUA will remain  in effect (meaning this test can be used) for the duration of the  Covid-19 declaration under Section 564(b)(1) of the Act, 21  U.S.C. section 360bbb-3(b)(1), unless the authorization is  terminated or revoked. Performed at [facility], [place] [address] [place], [place] [address]   Urine culture     Status: None   Collection Time: 07/10/19  4:18 AM   Specimen: In/Out Cath Urine  Result Value Ref Range Status   Specimen Description IN/OUT CATH URINE  Final   Special Requests NONE  Final   Culture   Final    NO GROWTH Performed at [facility], [place] [address] [place], [place] [address]    Report Status 07/10/2019 FINAL  Final  MRSA PCR Screening     Status: None   Collection Time: 07/10/19  5:27 AM   Specimen: Nasal Mucosa; Nasopharyngeal  Result Value Ref Range Status   MRSA by PCR NEGATIVE NEGATIVE Final    Comment:        The GeneXpert MRSA Assay (FDA approved for NASAL specimens only), is one component of a comprehensive MRSA colonization surveillance program. It is not intended to diagnose MRSA infection nor to guide or monitor treatment for MRSA infections. Performed at [facility], [place] [address], [place], [place] [address]     Radiology Reports CT ANGIO CHEST PE W OR WO CONTRAST  Result Date: 07/10/2019 CLINICAL DATA:  Dyspnea on exertion. Increasing shortness of breath. COVID-19. EXAM: CT ANGIOGRAPHY CHEST WITH CONTRAST TECHNIQUE: Multidetector CT imaging of the chest was performed using the standard protocol during bolus administration of intravenous contrast. Multiplanar CT image reconstructions and MIPs were obtained to evaluate the vascular anatomy. CONTRAST:   24m OMNIPAQUE IOHEXOL 350 MG/ML SOLN COMPARISON:  Chest x-ray dated 08/01/2019 FINDINGS: Cardiovascular: No pulmonary emboli. Heart size is normal. No pericardial effusion. Aortic atherosclerosis. Mediastinum/Nodes: No enlarged mediastinal, hilar, or axillary lymph nodes. Thyroid gland, trachea, and esophagus demonstrate no significant findings. Lungs/Pleura: Extensive bilateral primarily peripheral pulmonary infiltrates most severe in the right upper lobe and in both lower lobes. No effusions. Upper Abdomen: No acute abnormality. 13 mm stone in the lower mid left kidney, unchanged since the prior CT scan of the abdomen dated 11/15/2010 Musculoskeletal: No acute abnormality. Osteophytes fuse multiple levels in the lower thoracic spine. Review of the MIP images confirms the above findings. IMPRESSION: 1. No pulmonary emboli. 2. Extensive bilateral primarily peripheral pulmonary infiltrates consistent with pneumonia. Electronically Signed   By: JLorriane ShireM.D.   On: 07/10/2019 11:23   DG Chest Port 1 View  Result Date: 07/10/2019 CLINICAL DATA:  Shortness of breath. EXAM: PORTABLE CHEST 1 VIEW COMPARISON:  July 31, 2018 FINDINGS: Moderate to marked severity infiltrates are seen within the bilateral lung bases and along the periphery of the mid to upper right lung. There is no evidence of a pleural effusion or pneumothorax. The heart size and mediastinal contours are within normal limits. Multilevel degenerative changes seen throughout the thoracic spine. IMPRESSION: 1.  Moderate to marked severity bilateral infiltrates, right greater than left. Electronically Signed   By: Virgina Norfolk M.D.   On: 07/29/2019 23:10

## 2019-07-12 NOTE — Progress Notes (Signed)
Bilateral lower extremity venous duplex exam completed.  Gave results to [place], Charity fundraiser.  Preliminary results can be found under CV proc under chart review.  07/12/2019 12:57 PM  Hurman Ketelsen, K., RDMS, RVT

## 2019-07-12 NOTE — Evaluation (Addendum)
Occupational Therapy Evaluation Patient Details Name: [name] MRN: [id number] DOB: 1940-07-05 Today's Date: 07/12/2019    History of Present Illness 79 y.o. male with medical history significant of Anxiety, HLD, neuropathy presented to ED 07/15/2019 for  difficulty breathing and generalized weakness for the last 4 weeks. Found + afib, fever, RR 40s, sats 70s on RA. + COVID with pna. CT angio chest negative for PE   Clinical Impression   PTA, pt was living with his wife and was independent. Pt currently requiring Min A for ADLs and functional mobility. Pt presenting with impulsivity, poor safety awareness, decreased cognition, weakness, poor balance, and decreased activity tolerance. Pt SpO2 dropping to 84% on 15L via HFNC with activity; requiring seated rest break, max cues for purse lip breathing, and shortness of breath. Requiring cues to stop talking and focus on breathing and pt highly internally and externally distracted. Pt would benefit from further acute OT to facilitate safe dc. Recommend dc to home once medically stable per phsyician.       Follow Up Recommendations  No OT follow up;Supervision/Assistance - 24 hour(Pending progress may need HH)    Equipment Recommendations  3 in 1 bedside commode(for shower)    Recommendations for Other Services PT consult     Precautions / Restrictions Precautions Precautions: Fall;Other (comment) Precaution Comments: desaturates easily Restrictions Weight Bearing Restrictions: No      Mobility Bed Mobility Overal bed mobility: Needs Assistance Bed Mobility: Supine to Sit     Supine to sit: Min guard     General bed mobility comments: Min Guard A for safety  Transfers Overall transfer level: Needs assistance Equipment used: None Transfers: Sit to/from [facility] Sit to Stand: Min guard         General transfer comment: Min Guard A for safety. Poor safety awareness and requiring cues throughout     Balance Overall balance assessment: Mild deficits observed, not formally tested                                         ADL either performed or assessed with clinical judgement   ADL Overall ADL's : Needs assistance/impaired Eating/Feeding: Set up;Sitting   Grooming: Set up;Supervision/safety;Sitting   Upper Body Bathing: Minimal assistance;Sitting   Lower Body Bathing: Minimal assistance;Sit to/from stand   Upper Body Dressing : Minimal assistance;Sitting   Lower Body Dressing: Minimal assistance;Sit to/from stand   Toilet Transfer: Minimal assistance;Ambulation(simulated to recliner) Armed forces technical officer Details (indicate cue type and reason): Min A for standing balance         Functional mobility during ADLs: Minimal assistance General ADL Comments: Pt presenting with decreased balalnce, strength, cognition, and acitivty tolerance.     Vision         Perception     Praxis      Pertinent Vitals/Pain Pain Assessment: Faces Faces Pain Scale: No hurt Pain Intervention(s): Monitored during session     Hand Dominance     Extremity/Trunk Assessment Upper Extremity Assessment Upper Extremity Assessment: Generalized weakness   Lower Extremity Assessment Lower Extremity Assessment: Defer to PT evaluation   Cervical / Trunk Assessment Cervical / Trunk Assessment: Kyphotic   Communication Communication Communication: No difficulties   Cognition Arousal/Alertness: Awake/alert Behavior During Therapy: Restless;Impulsive Overall Cognitive Status: Impaired/Different from baseline Area of Impairment: Memory;Attention;Following commands;Safety/judgement;Awareness;Problem solving  Current Attention Level: Sustained Memory: Decreased short-term memory Following Commands: Follows one step commands inconsistently;Follows one step commands with increased time Safety/Judgement: Decreased awareness of safety;Decreased awareness of  deficits Awareness: Intellectual;Emergent Problem Solving: Slow processing;Requires verbal cues;Difficulty sequencing General Comments: Pt highly ditractable and requiring Max cues for safety throughout. Poor safety awareness.   General Comments  SpO2 dropping to 84% on 15L via HFNC with activity. Max cues for purse lip breathing and seated rest break to recover back to >88%    Exercises Exercises: Other exercises Other Exercises Other Exercises: Reviewed flutter valve and IS. Pt verablized understanding   Shoulder Instructions      Home Living Family/patient expects to be discharged to:: Private residence Living Arrangements: Alone Available Help at Discharge: Family Type of Home: Other(Comment)( lives in extended care hotel) Home Access: Stairs to enter Secretary/administrator of Steps: 1   Home Layout: One level     Bathroom Shower/Tub: IT trainer: Standard     Home Equipment: Grab bars - tub/shower   Additional Comments: has brother and sister-in-law live nearby and they have been doing his grocery shopping for him since COVID; he drives (last time was to dentist--where he insists he got COVID)      Prior Functioning/Environment Level of Independence: Independent                 OT Problem List: Decreased strength;Decreased range of motion;Decreased activity tolerance;Impaired balance (sitting and/or standing);Decreased cognition;Decreased safety awareness;Decreased knowledge of use of DME or AE;Decreased knowledge of precautions;Cardiopulmonary status limiting activity      OT Treatment/Interventions: Self-care/ADL training;Therapeutic exercise;Energy conservation;DME and/or AE instruction;Therapeutic activities;Patient/family education    OT Goals(Current goals can be found in the care plan section) Acute Rehab OT Goals Patient Stated Goal: get rid of all these lines and tubes OT Goal Formulation: With patient Time For Goal  Achievement: 07/26/19 Potential to Achieve Goals: Good  OT Frequency: Min 3X/week   Barriers to D/C:            Co-evaluation PT/OT/SLP Co-Evaluation/Treatment: Yes Reason for Co-Treatment: For patient/therapist safety;To address functional/ADL transfers   OT goals addressed during session: ADL's and self-care      AM-PAC OT "6 Clicks" Daily Activity     Outcome Measure Help from another person eating meals?: None Help from another person taking care of personal grooming?: A Little Help from another person toileting, which includes using toliet, bedpan, or urinal?: A Little Help from another person bathing (including washing, rinsing, drying)?: A Little Help from another person to put on and taking off regular upper body clothing?: A Little Help from another person to put on and taking off regular lower body clothing?: A Little 6 Click Score: 19   End of Session Equipment Utilized During Treatment: Oxygen(15L via HFNC) Nurse Communication: Mobility status  Activity Tolerance: Patient tolerated treatment well Patient left: in chair;with call bell/phone within reach;with chair alarm set  OT Visit Diagnosis: Unsteadiness on feet (R26.81);Other abnormalities of gait and mobility (R26.89);Muscle weakness (generalized) (M62.81);Other symptoms and signs involving cognitive function                Time: 4098-1191 OT Time Calculation (min): 21 min Charges:  OT General Charges $OT Visit: 1 Visit OT Evaluation $OT Eval Moderate Complexity: 1 Mod  Nylia Gavina MSOT, OTR/L Acute Rehab Pager: [phone] Office: [phone]  Theodoro Grist Anastacio Bua 07/12/2019, 4:54 PM

## 2019-07-13 LAB — COMPREHENSIVE METABOLIC PANEL
ALT: 76 U/L — ABNORMAL HIGH (ref 0–44)
AST: 69 U/L — ABNORMAL HIGH (ref 15–41)
Albumin: 3 g/dL — ABNORMAL LOW (ref 3.5–5.0)
Alkaline Phosphatase: 87 U/L (ref 38–126)
Anion gap: 13 (ref 5–15)
BUN: 35 mg/dL — ABNORMAL HIGH (ref 8–23)
CO2: 22 mmol/L (ref 22–32)
Calcium: 8.1 mg/dL — ABNORMAL LOW (ref 8.9–10.3)
Chloride: 102 mmol/L (ref 98–111)
Creatinine, Ser: 0.99 mg/dL (ref 0.61–1.24)
GFR calc Af Amer: >60 mL/min (ref >60)
GFR calc non Af Amer: >60 mL/min (ref >60)
Glucose, Bld: 122 mg/dL — ABNORMAL HIGH (ref 70–99)
Potassium: 4.5 mmol/L (ref 3.5–5.1)
Sodium: 137 mmol/L (ref 135–145)
Total Bilirubin: 1.7 mg/dL — ABNORMAL HIGH (ref 0.3–1.2)
Total Protein: 6.8 g/dL (ref 6.5–8.1)

## 2019-07-13 LAB — CBC WITH DIFFERENTIAL/PLATELET
Abs Immature Granulocytes: 0.18 10*3/uL — ABNORMAL HIGH (ref 0.00–0.07)
Basophils Absolute: 0 10*3/uL (ref 0.0–0.1)
Basophils Relative: 0 %
Eosinophils Absolute: 0 10*3/uL (ref 0.0–0.5)
Eosinophils Relative: 0 %
HCT: 44.5 % (ref 39.0–52.0)
Hemoglobin: 15.1 g/dL (ref 13.0–17.0)
Immature Granulocytes: 1 %
Lymphocytes Relative: 6 %
Lymphs Abs: 1 10*3/uL (ref 0.7–4.0)
MCH: 29 pg (ref 26.0–34.0)
MCHC: 33.9 g/dL (ref 30.0–36.0)
MCV: 85.6 fL (ref 80.0–100.0)
Monocytes Absolute: 1 10*3/uL (ref 0.1–1.0)
Monocytes Relative: 6 %
Neutro Abs: 13.6 10*3/uL — ABNORMAL HIGH (ref 1.7–7.7)
Neutrophils Relative %: 87 %
Platelets: 147 10*3/uL — ABNORMAL LOW (ref 150–400)
RBC: 5.2 MIL/uL (ref 4.22–5.81)
RDW: 13.6 % (ref 11.5–15.5)
WBC: 15.8 10*3/uL — ABNORMAL HIGH (ref 4.0–10.5)
nRBC: 0 % (ref 0.0–0.2)

## 2019-07-13 LAB — C-REACTIVE PROTEIN: CRP: 3.1 mg/dL — ABNORMAL HIGH (ref <1.0)

## 2019-07-13 LAB — MAGNESIUM: Magnesium: 2.3 mg/dL (ref 1.7–2.4)

## 2019-07-13 LAB — BRAIN NATRIURETIC PEPTIDE: B Natriuretic Peptide: 77.1 pg/mL (ref 0.0–100.0)

## 2019-07-13 LAB — D-DIMER, QUANTITATIVE: D-Dimer, Quant: >20 ug/mL-FEU — ABNORMAL HIGH (ref 0.00–0.50)

## 2019-07-13 LAB — PROCALCITONIN: Procalcitonin: <0.1 ng/mL

## 2019-07-13 LAB — T3: T3, Total: 58 ng/dL — ABNORMAL LOW (ref 71–180)

## 2019-07-13 MED ORDER — HALOPERIDOL LACTATE 5 MG/ML IJ SOLN
5.0000 mg | Freq: Once | INTRAMUSCULAR | Status: AC
  Administered 2019-07-13: 5 mg via INTRAVENOUS
  Filled 2019-07-13: qty 1

## 2019-07-13 MED ORDER — METHYLPREDNISOLONE SODIUM SUCC 40 MG IJ SOLR
30.0000 mg | INTRAMUSCULAR | Status: DC
  Administered 2019-07-13 – 2019-07-14 (×2): 30 mg via INTRAVENOUS
  Filled 2019-07-13 (×2): qty 1

## 2019-07-13 MED ORDER — DILTIAZEM HCL-DEXTROSE 125-5 MG/125ML-% IV SOLN (PREMIX)
5.0000 mg/h | INTRAVENOUS | Status: DC
  Administered 2019-07-13 – 2019-07-14 (×2): 5 mg/h via INTRAVENOUS
  Filled 2019-07-13 (×2): qty 125

## 2019-07-13 MED ORDER — METHIMAZOLE 5 MG PO TABS
5.0000 mg | ORAL_TABLET | Freq: Three times a day (TID) | ORAL | Status: DC
  Administered 2019-07-13 – 2019-07-14 (×2): 5 mg via ORAL
  Filled 2019-07-13 (×3): qty 1

## 2019-07-13 MED ORDER — DILTIAZEM LOAD VIA INFUSION
10.0000 mg | Freq: Once | INTRAVENOUS | Status: AC
  Administered 2019-07-13: 19:00:00 10 mg via INTRAVENOUS
  Filled 2019-07-13: qty 10

## 2019-07-13 MED ORDER — CHLORHEXIDINE GLUCONATE CLOTH 2 % EX PADS
6.0000 | MEDICATED_PAD | Freq: Every day | CUTANEOUS | Status: DC
  Administered 2019-07-13 – 2019-07-17 (×5): 6 via TOPICAL

## 2019-07-13 MED ORDER — TAMSULOSIN HCL 0.4 MG PO CAPS
0.4000 mg | ORAL_CAPSULE | Freq: Every day | ORAL | Status: DC
  Administered 2019-07-14: 08:00:00 0.4 mg via ORAL
  Filled 2019-07-13 (×2): qty 1

## 2019-07-13 MED ORDER — LORAZEPAM 2 MG/ML IJ SOLN
1.0000 mg | Freq: Once | INTRAMUSCULAR | Status: AC
  Administered 2019-07-13: 05:00:00 1 mg via INTRAVENOUS
  Filled 2019-07-13: qty 1

## 2019-07-13 MED ORDER — METOPROLOL TARTRATE 5 MG/5ML IV SOLN
5.0000 mg | Freq: Four times a day (QID) | INTRAVENOUS | Status: DC | PRN
  Administered 2019-07-17 (×2): 5 mg via INTRAVENOUS
  Filled 2019-07-13 (×2): qty 5

## 2019-07-13 NOTE — Progress Notes (Signed)
Attempted to call brother [name] to update on plan of care with no answer

## 2019-07-13 NOTE — Plan of Care (Signed)
  Problem: Education: Goal: Knowledge of General Education information will improve Description: Including pain rating scale, medication(s)/side effects and non-pharmacologic comfort measures Outcome: Progressing   Problem: Health Behavior/Discharge Planning: Goal: Ability to manage health-related needs will improve Outcome: Not Progressing   Problem: Clinical Measurements: Goal: Ability to maintain clinical measurements within normal limits will improve Outcome: Progressing Goal: Will remain free from infection Outcome: Progressing Goal: Diagnostic test results will improve Outcome: Progressing Goal: Respiratory complications will improve Outcome: Progressing Goal: Cardiovascular complication will be avoided Outcome: Progressing   Problem: Activity: Goal: Risk for activity intolerance will decrease Outcome: Progressing   Problem: Nutrition: Goal: Adequate nutrition will be maintained Outcome: Progressing   Problem: Coping: Goal: Level of anxiety will decrease Outcome: Progressing   Problem: Elimination: Goal: Will not experience complications related to bowel motility Outcome: Progressing Goal: Will not experience complications related to urinary retention Outcome: Progressing   Problem: Pain Managment: Goal: General experience of comfort will improve Outcome: Progressing   Problem: Safety: Goal: Ability to remain free from injury will improve Outcome: Progressing   Problem: Education: Goal: Knowledge of risk factors and measures for prevention of condition will improve Outcome: Not Progressing   Problem: Coping: Goal: Psychosocial and spiritual needs will be supported Outcome: Progressing   Problem: Respiratory: Goal: Will maintain a patent airway Outcome: Progressing Goal: Complications related to the disease process, condition or treatment will be avoided or minimized Outcome: Progressing

## 2019-07-13 NOTE — Progress Notes (Signed)
ANTICOAGULATION CONSULT NOTE  Pharmacy Consult for Heparin - > apixaban -> to Lovenox Indication: atrial fibrillation / COVID / increased d - dimer  Allergies  Allergen Reactions  . Shellfish Allergy Anaphylaxis    Patient Measurements: Height: 6\' 1"  (185.4 cm) Weight: 210 lb 8.6 oz (95.5 kg) IBW/kg (Calculated) : 79.9  Vital Signs: Temp: 97.6 F (36.4 C) (02/05 0406) Temp Source: Axillary (02/05 0406) BP: 133/58 (02/05 0406) Pulse Rate: 77 (02/05 0406)  Labs: Recent Labs    07/10/19 1009 07/10/19 1009 07/11/19 0531 07/11/19 0531 07/12/19 0358 07/13/19 0647  HGB 13.8   < > 13.0   < > 14.1 15.1  HCT 41.4   < > 38.4*  --  41.4 44.5  PLT 135*   < > 134*  --  128* 147*  HEPARINUNFRC 0.26*  --   --   --   --   --   CREATININE 1.00   < > 1.06  --  0.91 0.99   < > = values in this interval not displayed.    Estimated Creatinine Clearance: 70.6 mL/min (by C-G formula based on SCr of 0.99 mg/dL).   Medical History: Past Medical History:  Diagnosis Date  . Anxiety   . Arthritis   . Chest pain   . Dizziness   . Dyslipidemia   . Neuropathy   . Other urinary problems      Assessment: 79 y/o M with atrial fibrillation, elevated D -dimer, and Covid Continues on Lovenox CBC stable  Goal of Therapy:  Monitor platelets by anticoagulation protocol: Yes   Plan:  Lovenox 95 mg sq Q 12 hours  Watch CBC Monitor for bleeding  Thank you 62, PharmD

## 2019-07-13 NOTE — Progress Notes (Signed)
Rn entered pts room, pt sitting sideways on chair with legs over side with nasal canula off, oxygen saturation in 70's RN attempted to help pt back to chair, and place HFNC back on pt, at this point pt hitting and punching RN, refusing to stay in chair, and biting HFNC making it impossible for RN to put back on, this RN called for assistance with placing pt back on oxygen, once pt was placed back on HFNC pt oxygen saturation only up to 84% on 15L HFNC with increased WOB  , pt was placed on non rebreather which pt refused to wear and continued to pull off along with HFNC, Dr. Elizabeth Palau notfied and verbal order for bilateral wrist restraints and posey belt.

## 2019-07-13 NOTE — Progress Notes (Signed)
   07/13/19 1100  Urine Characteristics  Bladder Scan Volume (mL) 581 mL    Dr Thedore Mins notified verbal order for Foley catheter

## 2019-07-13 NOTE — Progress Notes (Signed)
SLP Cancellation Note  Patient Details Name: [name] MRN: [id number] DOB: 07-28-1940   Cancelled treatment:        Attempted intervention for dysphagia and possible texture upgrade. Bilateral mitts and wrist restraints donned on ST arrival. He appeared anxious versus and/or in distress. Answered questions with poor intelligibility but keeping eye closed-almost panicky. He adamantly refused oral care, moving head away and asking therapist to "leave me alone." Not agreeable to sips water when straw touched to lips. SpO2 reading 87% but question accuracy of waveform. Per notes condition may be declining. Will follow.    Royce Macadamia 07/13/2019, 3:42 PM   Breck Coons Lonell Face.Ed Nurse, children's [phone] Office [phone]

## 2019-07-13 NOTE — Progress Notes (Signed)
Code status documentation:    I've reviewed this patient's case in detail with the attending physician, Dr. Thedore Mins, and reviewed this patient's past medical history, functional status, and current medical condition. I am asked to provide input regarding appropriateness of full code status. This patient has multiple comorbidities-and has severe hypoxia from covid-19 PNA-he appears very weak and debilitated. In the event that he suffered a cardiorespiratory arrest, I do not believe he would be able to be succesfully revived to any functional degree. I concur with Dr. Thedore Mins that this patient should receive all care possible up to the point of cardiac or respiratory arrest, but should be otherwise made a DNR.  Windell Norfolk, MD 07/13/2019 1:09 PM

## 2019-07-13 NOTE — Progress Notes (Signed)
More agitated and confused yesterday afternoon, becoming physically combative. Could be related to high dose steroids as well as the hypoxia from the acute virasl illness. Continues to have intermittent atrial fibrillation, but episodes tend to be brief and relatively mild RVR (120s). The episodes of afib appear to be well tolerated hemodynamically. Mildly hyperthyroid per labs, suppressed TSH (mildly) and elevated FT4. Hard to distinguish from sick euthyroid syndrome at this point. Will continue current dose of beta blockers for now. I do not think he needs amiodarone at this time. If he cannot be given PO meds, switch to metoprolol IV 5 mg Q4-6h and can add diltiazem infusion. Currently on SQ Lovenox. Can switch to apixaban 5 mg twice daily.  Thurmon Fair, MD, [facility] [phone] office [phone] pager

## 2019-07-13 NOTE — Progress Notes (Addendum)
Patient is extremely anxious and states he is uncomfortable Attempted to make patient comfortable by transferring them multiple times from bed to chair with the assistance of another RN & nurse tech. He stated that "he doesn't want to die", while pulling his oxygen off numerous amount of times & desats in the 70s. He is highly confused and yelling with agitation.   Paged MD on call.

## 2019-07-13 NOTE — Progress Notes (Signed)
   07/13/19 0830  Restraint Order  Length of Order Daily  Restraint Status START  Assessment  Less Restrictive Interventions Attempted Yes  Health history reviewed prior to applying restraint Yes  Justification  Clinical Justification Removal of equipment;Pulling lines  Plan to Progress Out Continue safety plan  Education  Discontinuation Criteria No longer interfering with care  Discontinuation Criteria Explained Yes  Family Notification Family not available at this time (RN attempted to call brother [name] number in chart )  Restraint Every 2 Hour Monitoring  Airway Clear with Spontaneous Respirations Yes  Circulation / Skin Integrity No signs of injury  Emotional / Mental Status Agitated/restless;Confused  Range of Motion Performed  Food and Fluids Patient declined  Elimination Other (Comment) (Condom catheter )  Patient's rights, dignity, safety maintained Yes  Can Restraints be Less Restrictive or Discontinued? No  Non-violent Restraints  Soft Restraint Right Wrist Initiated  Soft Restraint Left Wrist Initiated  Soft Waist Belt Initiated    Restraints initiated per order

## 2019-07-13 NOTE — Progress Notes (Signed)
Pt transferred from bed to chair soft wrist restraints still in place, going to trial pt out of posey belt

## 2019-07-13 NOTE — Progress Notes (Signed)
HR sustaining 120's Dr Thedore Mins notified new orders placed

## 2019-07-13 NOTE — Progress Notes (Signed)
Verbal order from Dr. Thedore Mins 5 mg IV haldol once

## 2019-07-13 NOTE — Progress Notes (Addendum)
PROGRESS NOTE                                                                                                                                                                                                             Patient Demographics:    [name], is a 79 y.o. male, DOB - 04/02/1941, JJK:[id number]  Outpatient Primary MD for the patient is Raeanne Gathers, MD    LOS - 3  Admit date - 08/02/2019    Chief Complaint  Patient presents with  . Shortness of Breath       Brief Narrative  [name] is a 79 y.o. male with medical history significant of Anxiety, HLD.  Pt presented to ED with SOB.   He was apparently sick for 3 to 4 days before seeking medical attention, upon arrival to the ER his work-up suggested acute hypoxic respiratory failure due to COVID-19 pneumonia, he was also found to have new onset A. fib and RVR.    Subjective:   Patient in recliner but is slightly more confused this morning, unable to answer questions or follow commands reliably.    Assessment  & Plan :     1. Acute Hypoxic Resp. Failure due to Acute Covid 19 Viral Pneumonitis during the ongoing 2020 Covid 19 Pandemic - he has severe disease, currently on 12 L high flow oxygen, in moderate to severe respiratory distress, extremely elevated CRP and D-dimer despite steroids Eliquis.  At this time I will increase his IV steroids, continue remdesivir, after informed consent he already received Actemra on 07/10/2019.  Continue to monitor clinically, overall condition is tenuous if he declines further he will be an extremely poor candidate for mechanical ventilation or intubation, two-physician DNR has been made, if he declines any further we will focus on full comfort. 3 attempts of contacting brother on 3 consecutive days have failed, detailed messages have been left.  Encouraged the patient to sit up in chair in the daytime use I-S and flutter  valve for pulmonary toiletry and then prone in bed when at night.    SpO2: (!) 87 % O2 Flow Rate (L/min): 15 L/min FiO2 (%): 95 %  Recent Labs  Lab 07/22/2019 2305 07/10/19 0012 07/10/19 0022 07/10/19 1009 07/11/19 0531 07/12/19 0358 07/13/19 0647  CRP 24.0*  --   --  21.1*  13.6* 7.6* 3.1*  DDIMER  --   --  >20.00* >20.00* >20.00* >20.00* >20.00*  FERRITIN  --   --  746* 806* 822*  --   --   BNP  --   --   --   --  88.7 94.9 77.1  PROCALCITON  --   --  0.24  --  0.19 0.10 <0.10  SARSCOV2NAA  --  POSITIVE*  --   --   --   --   --     Hepatic Function Latest Ref Rng & Units 07/13/2019 07/12/2019 07/11/2019  Total Protein 6.5 - 8.1 g/dL 6.8 7.2 5.9(L)  Albumin 3.5 - 5.0 g/dL 3.0(L) 2.8(L) 2.3(L)  AST 15 - 41 U/L 69(H) 54(H) 52(H)  ALT 0 - 44 U/L 76(H) 68(H) 61(H)  Alk Phosphatase 38 - 126 U/L 87 101 84  Total Bilirubin 0.3 - 1.2 mg/dL 1.7(H) 1.5(H) 1.0     2.  New onset A. fib RVR [place] vas 2 score of at least 2.  Rate in good control, will place him on oral Cardizem and beta-blocker and titrate off his Cardizem drip.  Full anticoagulation.     Lab Results  Component Value Date   TSH 0.298 (L) 07/12/2019     3.  Acute bilateral lower extremity DVT.  On full dose Lovenox.  4.  Hypertension.  Currently on Cardizem and beta-blocker will monitor and adjust as needed.  5.  Hypothyroidism.  Will place on methimazole, outpatient endocrine follow-up.  6.  Urinary retention.  Placed on Foley Flomax on 07/13/2019.  7.  Toxic and metabolic encephalopathy.  Haldol as needed, avoid benzodiazepines and narcotics, restraints as needed to prevent self injury.    Condition - Extremely Guarded  Family Communication  :   Left message for patient's brother in detail on 07/11/2019 about poor prognosis, left message again for patient's brother on 07/12/2019 at 11:50 AM.  Message left again on 07/13/2019 at 12 PM again clearly informed that patient most likely will die this admission.  Code Status :  Two-physician DNR made.  Diet :    Diet Order            Diet full liquid Room service appropriate? Yes; Fluid consistency: Thin  Diet effective now               Disposition Plan  : Severely hypoxic on 15 L of oxygen, on full dose Lovenox for DVT and full Covid treatment.  Consults  : Cardiology  Procedures  :    CT angiogram.  No PE  Venous ultrasound legs.  PUD Prophylaxis :  None  DVT Prophylaxis  :  Lovenox    Lab Results  Component Value Date   PLT 147 (L) 07/13/2019    Inpatient Medications  Scheduled Meds: . Chlorhexidine Gluconate Cloth  6 each Topical Daily  . diltiazem  60 mg Oral Q8H  . doxycycline  100 mg Oral Q12H  . enoxaparin (LOVENOX) injection  95 mg Subcutaneous Q12H  . feeding supplement (ENSURE ENLIVE)  237 mL Oral BID BM  . mouth rinse  15 mL Mouth Rinse BID  . methimazole  5 mg Oral TID  . methylPREDNISolone (SOLU-MEDROL) injection  30 mg Intravenous Q24H  . metoprolol tartrate  100 mg Oral BID  . tamsulosin  0.4 mg Oral Daily   Continuous Infusions: . remdesivir 100 mg in NS 100 mL 100 mg (07/13/19 1003)   PRN Meds:.acetaminophen, guaiFENesin-dextromethorphan, haloperidol lactate, lip balm, [DISCONTINUED] ondansetron **  OR** ondansetron (ZOFRAN) IV, phenol, sodium chloride, white petrolatum  Antibiotics  :    Anti-infectives (From admission, onward)   Start     Dose/Rate Route Frequency Ordered Stop   07/11/19 1345  doxycycline (VIBRA-TABS) tablet 100 mg     100 mg Oral Every 12 hours 07/11/19 1344     07/11/19 1000  remdesivir 100 mg in sodium chloride 0.9 % 100 mL IVPB     100 mg 200 mL/hr over 30 Minutes Intravenous Daily 07/10/19 0223 07/15/19 0959   07/10/19 0300  remdesivir 200 mg in sodium chloride 0.9% 250 mL IVPB     200 mg 580 mL/hr over 30 Minutes Intravenous Once 07/10/19 0223 07/10/19 0551   07/30/2019 2345  cefTRIAXone (ROCEPHIN) 2 g in sodium chloride 0.9 % 100 mL IVPB  Status:  Discontinued     2 g 200 mL/hr over 30  Minutes Intravenous Every 24 hours 08/05/2019 2337 07/10/19 0222   07/24/2019 2345  azithromycin (ZITHROMAX) 500 mg in sodium chloride 0.9 % 250 mL IVPB  Status:  Discontinued     500 mg 250 mL/hr over 60 Minutes Intravenous Every 24 hours 08/04/2019 2337 07/10/19 0222       Time Spent in minutes  30   Lala Lund M.D on 07/13/2019 at 12:01 PM  To page go to [email or web address] - password [email or web address]  [facility] -  Office  [phone]    See all Orders from today for further details    Objective:   Vitals:   07/12/19 2032 07/13/19 0017 07/13/19 0406 07/13/19 0818  BP: (!) 122/95 105/65 (!) 133/58 (!) 127/93  Pulse: 90 74 77 (!) 118  Resp:      Temp: 97.8 F (36.6 C) 97.6 F (36.4 C) 97.6 F (36.4 C) 97.8 F (36.6 C)  TempSrc: Axillary Axillary Axillary Axillary  SpO2: 90% 93% 91% (!) 87%  Weight:      Height:        Wt Readings from Last 3 Encounters:  07/10/19 95.5 kg  07/31/18 90.7 kg  10/17/12 102.1 kg     Intake/Output Summary (Last 24 hours) at 07/13/2019 1201 Last data filed at 07/13/2019 0300 Gross per 24 hour  Intake --  Output 2000 ml  Net -2000 ml     Physical Exam  Awake more confused this morning, unable to answer questions or follow commands,  [place].AT,PERRAL Supple Neck,No JVD, No cervical lymphadenopathy appriciated.  Symmetrical Chest wall movement, coarse bibasilar Rales RRR,No Gallops, Rubs or new Murmurs, No Parasternal Heave +ve B.Sounds, Abd Soft, but does have some suprapubic fullness No Cyanosis, Clubbing or edema,      Data Review:    CBC Recent Labs  Lab 07/27/2019 2156 07/15/2019 2156 08/02/2019 2231 07/10/19 1009 07/11/19 0531 07/12/19 0358 07/13/19 0647  WBC 11.7*  --   --  9.9 10.4 11.4* 15.8*  HGB 15.9   < > 14.3 13.8 13.0 14.1 15.1  HCT 46.1   < > 42.0 41.4 38.4* 41.4 44.5  PLT PLATELET CLUMPS NOTED ON SMEAR, UNABLE TO ESTIMATE  --   --  135* 134* 128* 147*  MCV 85.4  --   --  86.6 85.9 85.9 85.6  MCH 29.4  --   --  28.9  29.1 29.3 29.0  MCHC 34.5  --   --  33.3 33.9 34.1 33.9  RDW 13.7  --   --  13.7 13.7 13.7 13.6  LYMPHSABS 0.5*  --   --  0.4* 0.6* 0.6* 1.0  MONOABS 0.6  --   --  0.2 0.6 0.4 1.0  EOSABS 0.0  --   --  0.0 0.0 0.0 0.0  BASOSABS 0.0  --   --  0.0 0.0 0.0 0.0   < > = values in this interval not displayed.    Chemistries  Recent Labs  Lab 07/29/2019 2156 07/19/2019 2156 07/24/2019 2231 07/10/19 1009 07/11/19 0531 07/12/19 0358 07/13/19 0647  NA 136   < > 137 141 142 140 137  K 5.0   < > 3.2* 3.3* 3.4* 3.9 4.5  CL 104  --   --  107 108 102 102  CO2 21*  --   --  21* _0 GLUCOSE 127*  --   --  183* 108* 142* 122*  BUN 35*  --   --  32* 35* 40* 35*  CREATININE 1.12  --   --  1.00 1.06 0.91 0.99  CALCIUM 7.8*  --   --  7.7* 7.7* 8.2* 8.1*  MG  --   --   --  2.7* 2.7* 2.4 2.3  AST 117*  --   --  62* 52* 54* 69*  ALT 102*  --   --  72* 61* 68* 76*  ALKPHOS 99  --   --  88 84 101 87  BILITOT 4.7*  --   --  1.2 1.0 1.5* 1.7*   < > = values in this interval not displayed.   ------------------------------------------------------------------------------------------------------------------ No results for input(s): CHOL, HDL, LDLCALC, TRIG, CHOLHDL, LDLDIRECT in the last 72 hours.  Lab Results  Component Value Date   HGBA1C 5.7 (H) 02/27/2012   ------------------------------------------------------------------------------------------------------------------ Recent Labs    07/12/19 1245  TSH 0.298*    Cardiac Enzymes No results for input(s): CKMB, TROPONINI, MYOGLOBIN in the last 168 hours.  Invalid input(s): CK ------------------------------------------------------------------------------------------------------------------    Component Value Date/Time   BNP 77.1 07/13/2019 0647    Micro Results Recent Results (from the past 240 hour(s))  Culture, blood (routine x 2)     Status: None (Preliminary result)   Collection Time: 07/18/2019 10:14 PM   Specimen: BLOOD  Result  Value Ref Range Status   Specimen Description BLOOD RIGHT ANTECUBITAL  Final   Special Requests   Final    BOTTLES DRAWN AEROBIC AND ANAEROBIC Blood Culture results may not be optimal due to an inadequate volume of blood received in culture bottles   Culture   Final    NO GROWTH 3 DAYS Performed at [facility], [name] [address], [place], [place] [address]    Report Status PENDING  Incomplete  Culture, blood (routine x 2)     Status: None (Preliminary result)   Collection Time: 07/10/19 12:12 AM   Specimen: BLOOD RIGHT FOREARM  Result Value Ref Range Status   Specimen Description BLOOD RIGHT FOREARM  Final   Special Requests   Final    BOTTLES DRAWN AEROBIC AND ANAEROBIC Blood Culture adequate volume   Culture   Final    NO GROWTH 2 DAYS Performed at [facility], [place] [address], [place], [place] [address]    Report Status PENDING  Incomplete  Respiratory Panel by RT PCR (Flu A&B, Covid) - Nasopharyngeal Swab     Status: Abnormal   Collection Time: 07/10/19 12:12 AM   Specimen: Nasopharyngeal Swab  Result Value Ref Range Status   SARS Coronavirus 2 by RT PCR POSITIVE (A) NEGATIVE Final    Comment: RESULT CALLED TO,  READ BACK BY AND VERIFIED WITH: VENEGAS L, RN AT 0211 ON 07/10/2019 BY SAINVILUS S (NOTE) SARS-CoV-2 target nucleic acids are DETECTED. SARS-CoV-2 RNA is generally detectable in upper respiratory specimens  during the acute phase of infection. Positive results are indicative of the presence of the identified virus, but do not rule out bacterial infection or co-infection with other pathogens not detected by the test. Clinical correlation with patient history and other diagnostic information is necessary to determine patient infection status. The expected result is Negative. Fact Sheet for Patients:  PinkCheek.be Fact Sheet for Healthcare Providers: GravelBags.it This test is not yet approved or  cleared by the [place] FDA and  has been authorized for detection and/or diagnosis of SARS-CoV-2 by FDA under an Emergency Use Authorization (EUA).  This EUA will remain in effect (meaning this test  can be used) for the duration of  the COVID-19 declaration under Section 564(b)(1) of the Act, 21 U.S.C. section 360bbb-3(b)(1), unless the authorization is terminated or revoked sooner.    Influenza A by PCR NEGATIVE NEGATIVE Final   Influenza B by PCR NEGATIVE NEGATIVE Final    Comment: (NOTE) The Xpert Xpress SARS-CoV-2/FLU/RSV assay is intended as an aid in  the diagnosis of influenza from Nasopharyngeal swab specimens and  should not be used as a sole basis for treatment. Nasal washings and  aspirates are unacceptable for Xpert Xpress SARS-CoV-2/FLU/RSV  testing. Fact Sheet for Patients: PinkCheek.be Fact Sheet for Healthcare Providers: GravelBags.it This test is not yet approved or cleared by the [place] FDA and  has been authorized for detection and/or diagnosis of SARS-CoV-2 by  FDA under an Emergency Use Authorization (EUA). This EUA will remain  in effect (meaning this test can be used) for the duration of the  Covid-19 declaration under Section 564(b)(1) of the Act, 21  U.S.C. section 360bbb-3(b)(1), unless the authorization is  terminated or revoked. Performed at [facility], [place] [address], [place], [place] [address]   Urine culture     Status: None   Collection Time: 07/10/19  4:18 AM   Specimen: In/Out Cath Urine  Result Value Ref Range Status   Specimen Description IN/OUT CATH URINE  Final   Special Requests NONE  Final   Culture   Final    NO GROWTH Performed at [facility], [place] [address] [place], [place] [address]    Report Status 07/10/2019 FINAL  Final  MRSA PCR Screening     Status: None   Collection Time: 07/10/19  5:27 AM   Specimen: Nasal Mucosa; Nasopharyngeal  Result  Value Ref Range Status   MRSA by PCR NEGATIVE NEGATIVE Final    Comment:        The GeneXpert MRSA Assay (FDA approved for NASAL specimens only), is one component of a comprehensive MRSA colonization surveillance program. It is not intended to diagnose MRSA infection nor to guide or monitor treatment for MRSA infections. Performed at [facility], [place] [address], [place], [place] [address]     Radiology Reports CT ANGIO CHEST PE W OR WO CONTRAST  Result Date: 07/10/2019 CLINICAL DATA:  Dyspnea on exertion. Increasing shortness of breath. COVID-19. EXAM: CT ANGIOGRAPHY CHEST WITH CONTRAST TECHNIQUE: Multidetector CT imaging of the chest was performed using the standard protocol during bolus administration of intravenous contrast. Multiplanar CT image reconstructions and MIPs were obtained to evaluate the vascular anatomy. CONTRAST:  53m OMNIPAQUE IOHEXOL 350 MG/ML SOLN COMPARISON:  Chest x-ray dated 07/12/2019 FINDINGS:  Cardiovascular: No pulmonary emboli. Heart size is normal. No pericardial effusion. Aortic atherosclerosis. Mediastinum/Nodes: No enlarged mediastinal, hilar, or axillary lymph nodes. Thyroid gland, trachea, and esophagus demonstrate no significant findings. Lungs/Pleura: Extensive bilateral primarily peripheral pulmonary infiltrates most severe in the right upper lobe and in both lower lobes. No effusions. Upper Abdomen: No acute abnormality. 13 mm stone in the lower mid left kidney, unchanged since the prior CT scan of the abdomen dated 11/15/2010 Musculoskeletal: No acute abnormality. Osteophytes fuse multiple levels in the lower thoracic spine. Review of the MIP images confirms the above findings. IMPRESSION: 1. No pulmonary emboli. 2. Extensive bilateral primarily peripheral pulmonary infiltrates consistent with pneumonia. Electronically Signed   By: Lorriane Shire M.D.   On: 07/10/2019 11:23   DG Chest Port 1 View  Result Date: 08/05/2019 CLINICAL DATA:  Shortness of  breath. EXAM: PORTABLE CHEST 1 VIEW COMPARISON:  July 31, 2018 FINDINGS: Moderate to marked severity infiltrates are seen within the bilateral lung bases and along the periphery of the mid to upper right lung. There is no evidence of a pleural effusion or pneumothorax. The heart size and mediastinal contours are within normal limits. Multilevel degenerative changes seen throughout the thoracic spine. IMPRESSION: 1. Moderate to marked severity bilateral infiltrates, right greater than left. Electronically Signed   By: Virgina Norfolk M.D.   On: 07/22/2019 23:10   VAS [place] LOWER EXTREMITY VENOUS (DVT)  Result Date: 07/12/2019  Lower Venous DVTStudy Indications: Swelling, and Covid+, Elevated D dimer.  Anticoagulation: Lovenox. Comparison Study: No prior exam. Performing Technologist: Baldwin Crown RDMS, RVT  Examination Guidelines: A complete evaluation includes B-mode imaging, spectral Doppler, color Doppler, and power Doppler as needed of all accessible portions of each vessel. Bilateral testing is considered an integral part of a complete examination. Limited examinations for reoccurring indications may be performed as noted. The reflux portion of the exam is performed with the patient in reverse Trendelenburg.  +---------+---------------+---------+-----------+----------+--------------+ RIGHT    CompressibilityPhasicitySpontaneityPropertiesThrombus Aging +---------+---------------+---------+-----------+----------+--------------+ CFV      Full           Yes      Yes                                 +---------+---------------+---------+-----------+----------+--------------+ SFJ      Full                                                        +---------+---------------+---------+-----------+----------+--------------+ FV Prox  Full                                                        +---------+---------------+---------+-----------+----------+--------------+ FV Mid   Full                                                         +---------+---------------+---------+-----------+----------+--------------+ FV DistalFull                                                        +---------+---------------+---------+-----------+----------+--------------+  PFV      Full                                                        +---------+---------------+---------+-----------+----------+--------------+ POP      None           No       No                   Acute          +---------+---------------+---------+-----------+----------+--------------+ PTV      None                                                        +---------+---------------+---------+-----------+----------+--------------+ PERO     None                                                        +---------+---------------+---------+-----------+----------+--------------+ Gastroc  None           No       No                   Acute          +---------+---------------+---------+-----------+----------+--------------+   +---------+---------------+---------+-----------+----------+--------------+ LEFT     CompressibilityPhasicitySpontaneityPropertiesThrombus Aging +---------+---------------+---------+-----------+----------+--------------+ CFV      Full           Yes      Yes                                 +---------+---------------+---------+-----------+----------+--------------+ SFJ      Full                                                        +---------+---------------+---------+-----------+----------+--------------+ FV Prox  Full                                                        +---------+---------------+---------+-----------+----------+--------------+ FV Mid   Full                                                        +---------+---------------+---------+-----------+----------+--------------+ FV DistalFull                                                         +---------+---------------+---------+-----------+----------+--------------+  PFV      Full                                                        +---------+---------------+---------+-----------+----------+--------------+ POP      Full           Yes      Yes                                 +---------+---------------+---------+-----------+----------+--------------+ PTV      None                                                        +---------+---------------+---------+-----------+----------+--------------+ PERO     None                                                        +---------+---------------+---------+-----------+----------+--------------+ Gastroc  None                                         Acute          +---------+---------------+---------+-----------+----------+--------------+     Summary: RIGHT: - Findings consistent with acute deep vein thrombosis involving the right popliteal vein, right posterior tibial veins, right peroneal veins, and right gastrocnemius veins. - No cystic structure found in the popliteal fossa.  LEFT: - Findings consistent with acute deep vein thrombosis involving the left posterior tibial veins, left peroneal veins, and one left gastrocnemius vein. - No cystic structure found in the popliteal fossa.  *See table(s) above for measurements and observations. Electronically signed by Servando Snare MD on 07/12/2019 at 2:47:28 PM.    Final

## 2019-07-14 ENCOUNTER — Inpatient Hospital Stay (HOSPITAL_COMMUNITY): Payer: Medicare Other

## 2019-07-14 HISTORY — PX: IR IVC FILTER PLMT / S&I /IMG GUID/MOD SED: IMG701

## 2019-07-14 LAB — MAGNESIUM: Magnesium: 2.2 mg/dL (ref 1.7–2.4)

## 2019-07-14 LAB — COMPREHENSIVE METABOLIC PANEL
ALT: 83 U/L — ABNORMAL HIGH (ref 0–44)
AST: 61 U/L — ABNORMAL HIGH (ref 15–41)
Albumin: 2.5 g/dL — ABNORMAL LOW (ref 3.5–5.0)
Alkaline Phosphatase: 69 U/L (ref 38–126)
Anion gap: 14 (ref 5–15)
BUN: 36 mg/dL — ABNORMAL HIGH (ref 8–23)
CO2: 19 mmol/L — ABNORMAL LOW (ref 22–32)
Calcium: 7.7 mg/dL — ABNORMAL LOW (ref 8.9–10.3)
Chloride: 103 mmol/L (ref 98–111)
Creatinine, Ser: 1.45 mg/dL — ABNORMAL HIGH (ref 0.61–1.24)
GFR calc Af Amer: 53 mL/min — ABNORMAL LOW (ref >60)
GFR calc non Af Amer: 46 mL/min — ABNORMAL LOW (ref >60)
Glucose, Bld: 284 mg/dL — ABNORMAL HIGH (ref 70–99)
Potassium: 4 mmol/L (ref 3.5–5.1)
Sodium: 136 mmol/L (ref 135–145)
Total Bilirubin: 1.8 mg/dL — ABNORMAL HIGH (ref 0.3–1.2)
Total Protein: 5.6 g/dL — ABNORMAL LOW (ref 6.5–8.1)

## 2019-07-14 LAB — CBC WITH DIFFERENTIAL/PLATELET
Abs Immature Granulocytes: 0.48 10*3/uL — ABNORMAL HIGH (ref 0.00–0.07)
Basophils Absolute: 0.1 10*3/uL (ref 0.0–0.1)
Basophils Relative: 0 %
Eosinophils Absolute: 0 10*3/uL (ref 0.0–0.5)
Eosinophils Relative: 0 %
HCT: 36.6 % — ABNORMAL LOW (ref 39.0–52.0)
Hemoglobin: 12.7 g/dL — ABNORMAL LOW (ref 13.0–17.0)
Immature Granulocytes: 3 %
Lymphocytes Relative: 7 %
Lymphs Abs: 1.4 10*3/uL (ref 0.7–4.0)
MCH: 29.5 pg (ref 26.0–34.0)
MCHC: 34.7 g/dL (ref 30.0–36.0)
MCV: 84.9 fL (ref 80.0–100.0)
Monocytes Absolute: 1.6 10*3/uL — ABNORMAL HIGH (ref 0.1–1.0)
Monocytes Relative: 8 %
Neutro Abs: 15.5 10*3/uL — ABNORMAL HIGH (ref 1.7–7.7)
Neutrophils Relative %: 82 %
Platelets: 203 10*3/uL (ref 150–400)
RBC: 4.31 MIL/uL (ref 4.22–5.81)
RDW: 13.3 % (ref 11.5–15.5)
WBC: 19.1 10*3/uL — ABNORMAL HIGH (ref 4.0–10.5)
nRBC: 0.2 % (ref 0.0–0.2)

## 2019-07-14 LAB — CULTURE, BLOOD (ROUTINE X 2): Culture: NO GROWTH

## 2019-07-14 LAB — TYPE AND SCREEN
ABO/RH(D): A POS
Antibody Screen: NEGATIVE

## 2019-07-14 LAB — CBC
HCT: 35.5 % — ABNORMAL LOW (ref 39.0–52.0)
Hemoglobin: 11.9 g/dL — ABNORMAL LOW (ref 13.0–17.0)
MCH: 29.1 pg (ref 26.0–34.0)
MCHC: 33.5 g/dL (ref 30.0–36.0)
MCV: 86.8 fL (ref 80.0–100.0)
Platelets: 206 10*3/uL (ref 150–400)
RBC: 4.09 MIL/uL — ABNORMAL LOW (ref 4.22–5.81)
RDW: 13.6 % (ref 11.5–15.5)
WBC: 26.9 10*3/uL — ABNORMAL HIGH (ref 4.0–10.5)
nRBC: 0.2 % (ref 0.0–0.2)

## 2019-07-14 LAB — BRAIN NATRIURETIC PEPTIDE: B Natriuretic Peptide: 75.6 pg/mL (ref 0.0–100.0)

## 2019-07-14 LAB — D-DIMER, QUANTITATIVE: D-Dimer, Quant: 15.24 ug/mL-FEU — ABNORMAL HIGH (ref 0.00–0.50)

## 2019-07-14 LAB — C-REACTIVE PROTEIN: CRP: 1 mg/dL — ABNORMAL HIGH (ref <1.0)

## 2019-07-14 LAB — PROCALCITONIN: Procalcitonin: <0.1 ng/mL

## 2019-07-14 MED ORDER — SODIUM CHLORIDE 0.9 % IV SOLN
3.0000 g | Freq: Four times a day (QID) | INTRAVENOUS | Status: DC
  Administered 2019-07-14 – 2019-07-15 (×3): 3 g via INTRAVENOUS
  Filled 2019-07-14: qty 8
  Filled 2019-07-14: qty 3
  Filled 2019-07-14 (×2): qty 8
  Filled 2019-07-14: qty 3

## 2019-07-14 MED ORDER — GUAIFENESIN 100 MG/5ML PO SOLN: 5.0000 mL | ORAL | Status: DC | PRN

## 2019-07-14 MED ORDER — LIDOCAINE HCL 1 % IJ SOLN
INTRAMUSCULAR | Status: AC
  Filled 2019-07-14: qty 20

## 2019-07-14 MED ORDER — FUROSEMIDE 10 MG/ML IJ SOLN: 40.0000 mg | Freq: Once | INTRAMUSCULAR | Status: DC

## 2019-07-14 MED ORDER — MORPHINE SULFATE (PF) 2 MG/ML IV SOLN: 1.0000 mg | INTRAVENOUS | Status: DC | PRN

## 2019-07-14 MED ORDER — IOHEXOL 300 MG/ML  SOLN
50.0000 mL | Freq: Once | INTRAMUSCULAR | Status: AC | PRN
  Administered 2019-07-14: 15 mL via INTRAVENOUS

## 2019-07-14 MED ORDER — LACTATED RINGERS IV SOLN: INTRAVENOUS | Status: DC

## 2019-07-14 MED ORDER — LACTATED RINGERS IV BOLUS: 1000.0000 mL | Freq: Once | INTRAVENOUS | Status: DC

## 2019-07-14 MED ORDER — LORAZEPAM 2 MG/ML IJ SOLN
1.0000 mg | Freq: Once | INTRAMUSCULAR | Status: AC | PRN
  Administered 2019-07-14: 13:00:00 1 mg via INTRAVENOUS
  Filled 2019-07-14: qty 1

## 2019-07-14 MED ORDER — FUROSEMIDE 10 MG/ML IJ SOLN
40.0000 mg | Freq: Once | INTRAMUSCULAR | Status: AC
  Administered 2019-07-14: 08:00:00 40 mg via INTRAVENOUS
  Filled 2019-07-14: qty 4

## 2019-07-14 MED ORDER — LORAZEPAM 2 MG/ML IJ SOLN
1.0000 mg | Freq: Once | INTRAMUSCULAR | Status: DC | PRN
  Administered 2019-07-14: 22:00:00 1 mg via INTRAVENOUS
  Filled 2019-07-14: qty 1

## 2019-07-14 MED ORDER — LACTATED RINGERS IV BOLUS
1000.0000 mL | Freq: Once | INTRAVENOUS | Status: AC
  Administered 2019-07-14: 1000 mL via INTRAVENOUS

## 2019-07-14 MED ORDER — IOHEXOL 300 MG/ML  SOLN
100.0000 mL | Freq: Once | INTRAMUSCULAR | Status: AC | PRN
  Administered 2019-07-14: 16:00:00 24 mL via INTRAVENOUS

## 2019-07-14 NOTE — Progress Notes (Signed)
Hospitalist ordered comfort care measures for patient.  Patient given prn IV ativan.  NRB removed from patient (patient continually trying to remove).  Patient placed on 10L/HFNC.  After IV ativan given, patient observed shortly after resting quietly.  Will continue to monitor and provide comfort care.

## 2019-07-14 NOTE — Progress Notes (Addendum)
PROGRESS NOTE                                                                                                                                                                                                             Patient Demographics:    [name], is a 79 y.o. male, DOB - 1940/08/08, MQK:[id number]  Outpatient Primary MD for the patient is Raeanne Gathers, MD    LOS - 4  Admit date - 07/13/2019    Chief Complaint  Patient presents with   Shortness of Breath       Brief Narrative  [name] is a 79 y.o. male with medical history significant of Anxiety, HLD.  Pt presented to ED with SOB.   He was apparently sick for 3 to 4 days before seeking medical attention, upon arrival to the ER his work-up suggested acute hypoxic respiratory failure due to COVID-19 pneumonia, he was also found to have new onset A. fib and RVR.    Subjective:   Patient in bed, more alert today, denies any headache chest or abdominal pain, improved shortness of breath, does have left flank pain and discomfort upon movement.    Assessment  & Plan :     Addendum - since late PM in more Resp distress, with ++ CBC, Low BP, likely has aspirated - most likely will due soon, gentle IVF, Unasyn, CXR - if no better - full comfort care.    1. Acute Hypoxic Resp. Failure due to Acute Covid 19 Viral Pneumonitis during the ongoing 2020 Covid 19 Pandemic - he has severe disease, currently on 12 L high flow oxygen, in moderate to severe respiratory distress, extremely elevated CRP and D-dimer despite steroids Eliquis.  At this time I will increase his IV steroids, continue remdesivir, after informed consent he already received Actemra on 07/10/2019.  Continue to monitor clinically, overall condition is tenuous if he declines further he will be an extremely poor candidate for mechanical ventilation or intubation, two-physician DNR has been made, if he  declines any further we will focus on full comfort. 3 attempts of contacting brother on 3 consecutive days have failed,  detailed messages have been left.  Encouraged the patient to sit up in chair in the daytime use I-S and flutter valve for pulmonary toiletry and then prone in bed when at night.    SpO2: 92 % O2 Flow Rate (L/min): 10 L/min FiO2 (%): 95 %  Recent Labs  Lab 08/03/2019 2305 07/10/19 0012 07/10/19 0022 07/10/19 1009 07/11/19 0531 07/12/19 0358 07/13/19 0647  CRP 24.0*  --   --  21.1* 13.6* 7.6* 3.1*  DDIMER  --   --  >20.00* >20.00* >20.00* >20.00* >20.00*  FERRITIN  --   --  746* 806* 822*  --   --   BNP  --   --   --   --  88.7 94.9 77.1  PROCALCITON  --   --  0.24  --  0.19 0.10 <0.10  SARSCOV2NAA  --  POSITIVE*  --   --   --   --   --     Hepatic Function Latest Ref Rng & Units 07/13/2019 07/12/2019 07/11/2019  Total Protein 6.5 - 8.1 g/dL 6.8 7.2 5.9(L)  Albumin 3.5 - 5.0 g/dL 3.0(L) 2.8(L) 2.3(L)  AST 15 - 41 U/L 69(H) 54(H) 52(H)  ALT 0 - 44 U/L 76(H) 68(H) 61(H)  Alk Phosphatase 38 - 126 U/L 87 101 84  Total Bilirubin 0.3 - 1.2 mg/dL 1.7(H) 1.5(H) 1.0     2.  New onset A. fib RVR [place] vas 2 score of at least 2.  Rate in good control, will place him on oral Cardizem and beta-blocker and titrate off his Cardizem drip.  Full anticoagulation.     Lab Results  Component Value Date   TSH 0.298 (L) 07/12/2019     3.  Acute bilateral lower extremity DVT.  Is on full dose Lovenox but now seems to have developed a left-sided psoas hematoma, hold anticoagulation, monitor H&H and place IVC filter.  4.  Left psoas hematoma.  Hold anticoagulation, IVC filter for DVT.    5. Hypertension.  Currently on Cardizem and beta-blocker will monitor and adjust as needed.  6.  Hypothyroidism.  Will place on methimazole, outpatient endocrine follow-up.  7.  Urinary retention.  Placed on Foley Flomax on 07/13/2019.  8.  Toxic and metabolic encephalopathy.  Haldol as needed, avoid  benzodiazepines and narcotics, restraints as needed to prevent self injury.    Condition - Extremely Guarded  Family Communication  :   Left message for patient's brother in detail on 07/11/2019 about poor prognosis, left message again for patient's brother on 07/12/2019 at 11:50 AM.  Message left again on 07/13/2019 at 12 PM again clearly informed that patient most likely will die this admission.  Called brother again on 07/14/2019 at 9 AM and again at 11:25 AM no response.  Code Status : Two-physician DNR made.  Diet :    Diet Order            Diet full liquid Room service appropriate? Yes; Fluid consistency: Thin  Diet effective now               Disposition Plan  : Severely hypoxic on 15 L of oxygen, on full dose Lovenox for DVT and full Covid treatment.  Consults  : Cardiology  Procedures  :    CT angiogram.  No PE  Venous ultrasound legs.  PUD Prophylaxis :  None  DVT Prophylaxis  :  Lovenox    Lab Results  Component Value Date   PLT 147 (L) 07/13/2019  Inpatient Medications  Scheduled Meds:  Chlorhexidine Gluconate Cloth  6 each Topical Daily   diltiazem  60 mg Oral Q8H   doxycycline  100 mg Oral Q12H   feeding supplement (ENSURE ENLIVE)  237 mL Oral BID BM   mouth rinse  15 mL Mouth Rinse BID   methimazole  5 mg Oral TID   methylPREDNISolone (SOLU-MEDROL) injection  30 mg Intravenous Q24H   metoprolol tartrate  100 mg Oral BID   tamsulosin  0.4 mg Oral Daily   Continuous Infusions:  diltiazem (CARDIZEM) infusion 5 mg/hr (07/14/19 0436)   remdesivir 100 mg in NS 100 mL 100 mg (07/14/19 1056)   PRN Meds:.acetaminophen, guaiFENesin, guaiFENesin-dextromethorphan, haloperidol lactate, lip balm, metoprolol tartrate, [DISCONTINUED] ondansetron **OR** ondansetron (ZOFRAN) IV, phenol, sodium chloride, white petrolatum  Antibiotics  :    Anti-infectives (From admission, onward)   Start     Dose/Rate Route Frequency Ordered Stop   07/11/19 1345   doxycycline (VIBRA-TABS) tablet 100 mg     100 mg Oral Every 12 hours 07/11/19 1344     07/11/19 1000  remdesivir 100 mg in sodium chloride 0.9 % 100 mL IVPB     100 mg 200 mL/hr over 30 Minutes Intravenous Daily 07/10/19 0223 07/15/19 0959   07/10/19 0300  remdesivir 200 mg in sodium chloride 0.9% 250 mL IVPB     200 mg 580 mL/hr over 30 Minutes Intravenous Once 07/10/19 0223 07/10/19 0551   07/29/2019 2345  cefTRIAXone (ROCEPHIN) 2 g in sodium chloride 0.9 % 100 mL IVPB  Status:  Discontinued     2 g 200 mL/hr over 30 Minutes Intravenous Every 24 hours 08/05/2019 2337 07/10/19 0222   07/21/2019 2345  azithromycin (ZITHROMAX) 500 mg in sodium chloride 0.9 % 250 mL IVPB  Status:  Discontinued     500 mg 250 mL/hr over 60 Minutes Intravenous Every 24 hours 07/14/2019 2337 07/10/19 0222       Time Spent in minutes  30   Lala Lund M.D on 07/14/2019 at 11:25 AM  To page go to [email or web address] - password [facility]  [facility] -  Office  [phone]    See all Orders from today for further details    Objective:   Vitals:   07/14/19 0625 07/14/19 0700 07/14/19 0845 07/14/19 1015  BP:  (!) 146/134 (!) 174/84 108/69  Pulse: (!) 107 (!) 112 84 70  Resp: (!) 30 (!) 29 (!) 40 (!) 34  Temp:  98 F (36.7 C) (!) 97.4 F (36.3 C) 98 F (36.7 C)  TempSrc:  Oral Oral Oral  SpO2: 94% 90% (!) 85% 92%  Weight:      Height:        Wt Readings from Last 3 Encounters:  07/10/19 95.5 kg  07/31/18 90.7 kg  10/17/12 102.1 kg     Intake/Output Summary (Last 24 hours) at 07/14/2019 1125 Last data filed at 07/14/2019 0513 Gross per 24 hour  Intake 219.3 ml  Output 2100 ml  Net -1880.7 ml     Physical Exam  Awake mildly confused, No new F.N deficits, Normal affect [place].AT,PERRAL Supple Neck,No JVD, No cervical lymphadenopathy appriciated.  Symmetrical Chest wall movement, Good air movement bilaterally, CTAB RRR,No Gallops, Rubs or new Murmurs, No Parasternal Heave +ve B.Sounds, Abd Soft,  L.flank tenderness No Cyanosis, Clubbing or edema, No new Rash or bruise    Data Review:    CBC Recent Labs  Lab 07/19/2019 2156 07/25/2019 2156 07/20/2019 2231 07/10/19 1009 07/11/19 0531  07/12/19 0358 07/13/19 0647  WBC 11.7*  --   --  9.9 10.4 11.4* 15.8*  HGB 15.9   < > 14.3 13.8 13.0 14.1 15.1  HCT 46.1   < > 42.0 41.4 38.4* 41.4 44.5  PLT PLATELET CLUMPS NOTED ON SMEAR, UNABLE TO ESTIMATE  --   --  135* 134* 128* 147*  MCV 85.4  --   --  86.6 85.9 85.9 85.6  MCH 29.4  --   --  28.9 29.1 29.3 29.0  MCHC 34.5  --   --  33.3 33.9 34.1 33.9  RDW 13.7  --   --  13.7 13.7 13.7 13.6  LYMPHSABS 0.5*  --   --  0.4* 0.6* 0.6* 1.0  MONOABS 0.6  --   --  0.2 0.6 0.4 1.0  EOSABS 0.0  --   --  0.0 0.0 0.0 0.0  BASOSABS 0.0  --   --  0.0 0.0 0.0 0.0   < > = values in this interval not displayed.    Chemistries  Recent Labs  Lab 07/20/2019 2156 08/02/2019 2156 07/25/2019 2231 07/10/19 1009 07/11/19 0531 07/12/19 0358 07/13/19 0647  NA 136   < > 137 141 142 140 137  K 5.0   < > 3.2* 3.3* 3.4* 3.9 4.5  CL 104  --   --  107 108 102 102  CO2 21*  --   --  21* 24 22 22   GLUCOSE 127*  --   --  183* 108* 142* 122*  BUN 35*  --   --  32* 35* 40* 35*  CREATININE 1.12  --   --  1.00 1.06 0.91 0.99  CALCIUM 7.8*  --   --  7.7* 7.7* 8.2* 8.1*  MG  --   --   --  2.7* 2.7* 2.4 2.3  AST 117*  --   --  62* 52* 54* 69*  ALT 102*  --   --  72* 61* 68* 76*  ALKPHOS 99  --   --  88 84 101 87  BILITOT 4.7*  --   --  1.2 1.0 1.5* 1.7*   < > = values in this interval not displayed.   ------------------------------------------------------------------------------------------------------------------ No results for input(s): CHOL, HDL, LDLCALC, TRIG, CHOLHDL, LDLDIRECT in the last 72 hours.  Lab Results  Component Value Date   HGBA1C 5.7 (H) 02/27/2012   ------------------------------------------------------------------------------------------------------------------ Recent Labs    07/12/19 1245    TSH 0.298*    Cardiac Enzymes No results for input(s): CKMB, TROPONINI, MYOGLOBIN in the last 168 hours.  Invalid input(s): CK ------------------------------------------------------------------------------------------------------------------    Component Value Date/Time   BNP 77.1 07/13/2019 0647    Micro Results Recent Results (from the past 240 hour(s))  Culture, blood (routine x 2)     Status: None   Collection Time: 07/15/2019 10:14 PM   Specimen: BLOOD  Result Value Ref Range Status   Specimen Description BLOOD RIGHT ANTECUBITAL  Final   Special Requests   Final    BOTTLES DRAWN AEROBIC AND ANAEROBIC Blood Culture results may not be optimal due to an inadequate volume of blood received in culture bottles   Culture   Final    NO GROWTH 5 DAYS Performed at [facility], [place] [address] [place], [place] [address]    Report Status 07/14/2019 FINAL  Final  Culture, blood (routine x 2)     Status: None (Preliminary result)   Collection Time: 07/10/19 12:12 AM  Specimen: BLOOD RIGHT FOREARM  Result Value Ref Range Status   Specimen Description BLOOD RIGHT FOREARM  Final   Special Requests   Final    BOTTLES DRAWN AEROBIC AND ANAEROBIC Blood Culture adequate volume   Culture   Final    NO GROWTH 4 DAYS Performed at [facility], [address], [place], [place] [address]    Report Status PENDING  Incomplete  Respiratory Panel by RT PCR (Flu A&B, Covid) - Nasopharyngeal Swab     Status: Abnormal   Collection Time: 07/10/19 12:12 AM   Specimen: Nasopharyngeal Swab  Result Value Ref Range Status   SARS Coronavirus 2 by RT PCR POSITIVE (A) NEGATIVE Final    Comment: RESULT CALLED TO, READ BACK BY AND VERIFIED WITH: VENEGAS L, RN AT 0211 ON 07/10/2019 BY SAINVILUS S (NOTE) SARS-CoV-2 target nucleic acids are DETECTED. SARS-CoV-2 RNA is generally detectable in upper respiratory specimens  during the acute phase of infection. Positive results are indicative of the  presence of the identified virus, but do not rule out bacterial infection or co-infection with other pathogens not detected by the test. Clinical correlation with patient history and other diagnostic information is necessary to determine patient infection status. The expected result is Negative. Fact Sheet for Patients:  PinkCheek.be Fact Sheet for Healthcare Providers: GravelBags.it This test is not yet approved or cleared by the [place] FDA and  has been authorized for detection and/or diagnosis of SARS-CoV-2 by FDA under an Emergency Use Authorization (EUA).  This EUA will remain in effect (meaning this test  can be used) for the duration of  the COVID-19 declaration under Section 564(b)(1) of the Act, 21 U.S.C. section 360bbb-3(b)(1), unless the authorization is terminated or revoked sooner.    Influenza A by PCR NEGATIVE NEGATIVE Final   Influenza B by PCR NEGATIVE NEGATIVE Final    Comment: (NOTE) The Xpert Xpress SARS-CoV-2/FLU/RSV assay is intended as an aid in  the diagnosis of influenza from Nasopharyngeal swab specimens and  should not be used as a sole basis for treatment. Nasal washings and  aspirates are unacceptable for Xpert Xpress SARS-CoV-2/FLU/RSV  testing. Fact Sheet for Patients: PinkCheek.be Fact Sheet for Healthcare Providers: GravelBags.it This test is not yet approved or cleared by the [place] FDA and  has been authorized for detection and/or diagnosis of SARS-CoV-2 by  FDA under an Emergency Use Authorization (EUA). This EUA will remain  in effect (meaning this test can be used) for the duration of the  Covid-19 declaration under Section 564(b)(1) of the Act, 21  U.S.C. section 360bbb-3(b)(1), unless the authorization is  terminated or revoked. Performed at [facility], [place] [address] [place], [place] [address]   Urine  culture     Status: None   Collection Time: 07/10/19  4:18 AM   Specimen: In/Out Cath Urine  Result Value Ref Range Status   Specimen Description IN/OUT CATH URINE  Final   Special Requests NONE  Final   Culture   Final    NO GROWTH Performed at [facility], [place] [address] [place], [place] [address]    Report Status 07/10/2019 FINAL  Final  MRSA PCR Screening     Status: None   Collection Time: 07/10/19  5:27 AM   Specimen: Nasal Mucosa; Nasopharyngeal  Result Value Ref Range Status   MRSA by PCR NEGATIVE NEGATIVE Final    Comment:        The GeneXpert MRSA Assay (FDA  approved for NASAL specimens only), is one component of a comprehensive MRSA colonization surveillance program. It is not intended to diagnose MRSA infection nor to guide or monitor treatment for MRSA infections. Performed at [facility], [place] [address] [place], [place] [address]     Radiology Reports CT ABDOMEN PELVIS WO CONTRAST  Result Date: 07/14/2019 CLINICAL DATA:  79 year old EXAM: CT ABDOMEN AND PELVIS WITHOUT CONTRAST TECHNIQUE: Multidetector CT imaging of the abdomen and pelvis was performed following the standard protocol without IV contrast. COMPARISON:  11/15/2010. FINDINGS: Lower chest: Peripheral ground-glass airspace opacities throughout the visualized lung bases. BILATERAL lower lobe bronchiectasis. No pleural effusions. Normal heart size. Three-vessel coronary atherosclerosis. Hepatobiliary: Normal unenhanced appearance of the liver. Gallbladder normal in appearance without calcified gallstones. No biliary ductal dilation. Pancreas: Normal unenhanced appearance. Spleen: Normal unenhanced appearance. Small focus of accessory splenic tissue anterior to the spleen at the hilum. Adrenals/Urinary Tract: Normal appearing adrenal glands. Non-obstructing approximate 12 x 15 mm calculus in a mid calyx of the LEFT kidney. Non-obstructing calculi in adjacent LOWER pole calices of the RIGHT kidney,  the largest measuring approximately 12 x 13 mm. No hydronephrosis. Parapelvic renal cysts as noted on the prior CT. Within the limits of the unenhanced technique, no significant focal parenchymal abnormality involving either kidney. Urinary bladder decompressed by Foley catheter, accounting for the gas in the bladder. Stomach/Bowel: Stomach normal in appearance for the degree of distention. Normal-appearing small bowel. Mobile cecum positioned in the RIGHT UPPER QUADRANT of the abdomen. Descending and sigmoid colon diverticulosis without evidence of acute diverticulitis. Remainder of the colon unremarkable. Surgically absent appendix. Vascular/Lymphatic: Moderate aortoiliofemoral atherosclerosis without evidence of aneurysm. No pathologic lymphadenopathy. Reproductive: Moderate to marked enlargement of the prostate gland. Normal seminal vesicles. Other: Large LEFT psoas hematoma extending into the LEFT ileus psoas muscle, measuring maximally approximately 12.3 x 9.6 x 18.8 cm. No evidence of intraperitoneal hemorrhage. Musculoskeletal: Mixed lucency and sclerosis involving the ischia bilaterally and the entire sacrum. Numerous sclerotic lesions in the iliac bones. Schmorl's node in the LOWER endplate of L4 anteriorly. Sclerotic lesion involving the posteroinferior L1 vertebral body. IMPRESSION: 1. Large LEFT psoas hematoma extending into the LEFT ileus psoas muscle, presumably a spontaneous in the absence of recent endovascular procedures. No evidence of intraperitoneal hemorrhage. 2. Peripheral ground-glass airspace opacities throughout the visualized lung bases bilaterally which are imaging features of COVID-19 pneumonia. Other processes such as influenza pneumonia and organizing pneumonia, as can be seen with drug toxicity and connective tissue disease, can cause a similar imaging pattern. 3. Osseous metastatic disease involving the sacrum, iliac bones and the posteroinferior L1 vertebral body. As there is  moderate to marked prostate gland enlargement, metastatic prostate cancer is a consideration. Please correlate with PSA. 4. Descending and sigmoid colon diverticulosis without evidence of acute diverticulitis. 5. Non-obstructing bilateral renal calculi. Aortic Atherosclerosis (ICD10-I70.0). These results will be called to the ordering clinician or representative by the Radiologist Assistant, and communication documented in the PACS or zVision Dashboard. Electronically Signed   By: Evangeline Dakin M.D.   On: 07/14/2019 10:41   CT ANGIO CHEST PE W OR WO CONTRAST  Result Date: 07/10/2019 CLINICAL DATA:  Dyspnea on exertion. Increasing shortness of breath. COVID-19. EXAM: CT ANGIOGRAPHY CHEST WITH CONTRAST TECHNIQUE: Multidetector CT imaging of the chest was performed using the standard protocol during bolus administration of intravenous contrast. Multiplanar CT image reconstructions and MIPs were obtained to evaluate the vascular anatomy. CONTRAST:  35m OMNIPAQUE IOHEXOL 350 MG/ML SOLN COMPARISON:  Chest x-ray dated 07/11/2019 FINDINGS: Cardiovascular: No pulmonary emboli. Heart size is normal. No pericardial effusion. Aortic atherosclerosis. Mediastinum/Nodes: No enlarged mediastinal, hilar, or axillary lymph nodes. Thyroid gland, trachea, and esophagus demonstrate no significant findings. Lungs/Pleura: Extensive bilateral primarily peripheral pulmonary infiltrates most severe in the right upper lobe and in both lower lobes. No effusions. Upper Abdomen: No acute abnormality. 13 mm stone in the lower mid left kidney, unchanged since the prior CT scan of the abdomen dated 11/15/2010 Musculoskeletal: No acute abnormality. Osteophytes fuse multiple levels in the lower thoracic spine. Review of the MIP images confirms the above findings. IMPRESSION: 1. No pulmonary emboli. 2. Extensive bilateral primarily peripheral pulmonary infiltrates consistent with pneumonia. Electronically Signed   By: Lorriane Shire M.D.   On:  07/10/2019 11:23   DG Chest Port 1 View  Result Date: 07/14/2019 CLINICAL DATA:  Shortness of breath. COVID positive. EXAM: PORTABLE CHEST 1 VIEW COMPARISON:  07/13/2019 FINDINGS: The cardiomediastinal silhouette is unremarkable. Bilateral airspace opacities are unchanged. No pneumothorax or definite pleural effusion. Little interval change from prior study. IMPRESSION: Unchanged chest radiograph with bilateral airspace opacities. Electronically Signed   By: Margarette Canada M.D.   On: 07/14/2019 10:18   DG Chest Port 1 View  Result Date: 07/20/2019 CLINICAL DATA:  Shortness of breath. EXAM: PORTABLE CHEST 1 VIEW COMPARISON:  July 31, 2018 FINDINGS: Moderate to marked severity infiltrates are seen within the bilateral lung bases and along the periphery of the mid to upper right lung. There is no evidence of a pleural effusion or pneumothorax. The heart size and mediastinal contours are within normal limits. Multilevel degenerative changes seen throughout the thoracic spine. IMPRESSION: 1. Moderate to marked severity bilateral infiltrates, right greater than left. Electronically Signed   By: Virgina Norfolk M.D.   On: 07/28/2019 23:10   VAS [place] LOWER EXTREMITY VENOUS (DVT)  Result Date: 07/12/2019  Lower Venous DVTStudy Indications: Swelling, and Covid+, Elevated D dimer.  Anticoagulation: Lovenox. Comparison Study: No prior exam. Performing Technologist: Baldwin Crown RDMS, RVT  Examination Guidelines: A complete evaluation includes B-mode imaging, spectral Doppler, color Doppler, and power Doppler as needed of all accessible portions of each vessel. Bilateral testing is considered an integral part of a complete examination. Limited examinations for reoccurring indications may be performed as noted. The reflux portion of the exam is performed with the patient in reverse Trendelenburg.  +---------+---------------+---------+-----------+----------+--------------+  RIGHT      Compressibility Phasicity Spontaneity Properties Thrombus Aging  +---------+---------------+---------+-----------+----------+--------------+  CFV       Full            Yes       Yes                                    +---------+---------------+---------+-----------+----------+--------------+  SFJ       Full                                                             +---------+---------------+---------+-----------+----------+--------------+  FV Prox   Full                                                             +---------+---------------+---------+-----------+----------+--------------+  FV Mid    Full                                                             +---------+---------------+---------+-----------+----------+--------------+  FV Distal Full                                                             +---------+---------------+---------+-----------+----------+--------------+  PFV       Full                                                             +---------+---------------+---------+-----------+----------+--------------+  POP       None            No        No                     Acute           +---------+---------------+---------+-----------+----------+--------------+  PTV       None                                                             +---------+---------------+---------+-----------+----------+--------------+  PERO      None                                                             +---------+---------------+---------+-----------+----------+--------------+  Gastroc   None            No        No                     Acute           +---------+---------------+---------+-----------+----------+--------------+   +---------+---------------+---------+-----------+----------+--------------+  LEFT      Compressibility Phasicity Spontaneity Properties Thrombus Aging  +---------+---------------+---------+-----------+----------+--------------+  CFV       Full            Yes       Yes                                     +---------+---------------+---------+-----------+----------+--------------+  SFJ       Full                                                             +---------+---------------+---------+-----------+----------+--------------+  FV Prox   Full                                                             +---------+---------------+---------+-----------+----------+--------------+  FV Mid    Full                                                             +---------+---------------+---------+-----------+----------+--------------+  FV Distal Full                                                             +---------+---------------+---------+-----------+----------+--------------+  PFV       Full                                                             +---------+---------------+---------+-----------+----------+--------------+  POP       Full            Yes       Yes                                    +---------+---------------+---------+-----------+----------+--------------+  PTV       None                                                             +---------+---------------+---------+-----------+----------+--------------+  PERO      None                                                             +---------+---------------+---------+-----------+----------+--------------+  Gastroc   None                                             Acute           +---------+---------------+---------+-----------+----------+--------------+     Summary: RIGHT: - Findings consistent with acute deep vein thrombosis involving the right popliteal vein, right posterior tibial veins, right peroneal veins, and right gastrocnemius veins. - No cystic structure found in the popliteal fossa.  LEFT: - Findings consistent with acute deep vein thrombosis involving the left posterior tibial veins, left peroneal veins, and one left gastrocnemius vein. - No cystic structure  found in the popliteal fossa.  *See table(s) above for  measurements and observations. Electronically signed by Servando Snare MD on 07/12/2019 at 2:47:28 PM.    Final

## 2019-07-14 NOTE — Progress Notes (Signed)
Pt continues to decline only responsive to pain, on a HFNC at 15L and nonrebreather with O2 sats in the mid 80's. Attempted to call pts brother x2 and 2 voice messages left as well. At this time will focus on transitioning to comfort measures.  Stevie Kern APRN-C [facility] Pager [phone]

## 2019-07-14 NOTE — Progress Notes (Signed)
Patient's status continues to decline despite repeated fluid boluses.  Patient responsive only to pain.  Attempted to contact patient's brother regarding his condition.  No answer, left message.  Paged telehospitalist.

## 2019-07-14 NOTE — Progress Notes (Addendum)
Nurse checked on patient about 0330.  Patient says he was in pain, and felt as if he needed to urinate.  Foley catheter checked and found to be intact and draining yellow, clear urine.  Foley adjusted and small amount of urine (<50 ml drained).  Bladder scan performed, 0 ml.  Patient states he just needs to be loose from restraints and he will go home.  Patient restraints readjusted and patient repositioned in bed.

## 2019-07-14 NOTE — Progress Notes (Signed)
Updated family, Kaulana Brindle ( pt. Sister-in law).

## 2019-07-14 NOTE — Progress Notes (Signed)
Pharmacy Antibiotic Note  [name] is a 79 y.o. male admitted on 07/15/2019 with aspiration PNA.  Pharmacy has been consulted for amp/sulb dosing.  Per MD note, patient with increased respiratory distress concerning for aspiration. On doxycycline, adding amp/sulb. WBC up to 26.9.   Plan: Start amp/sulb 3g Q6hr  Monitor cultures, clinical status, renal fx Narrow abx as able and f/u duration   Height: 6\' 1"  (185.4 cm) Weight: 210 lb 8.6 oz (95.5 kg) IBW/kg (Calculated) : 79.9  Temp (24hrs), Avg:97.9 F (36.6 C), Min:97.4 F (36.3 C), Max:98.1 F (36.7 C)  Recent Labs  Lab 07/27/2019 2156 07/25/2019 2227 07/10/19 0023 07/10/19 1009 07/10/19 1009 07/11/19 0531 07/12/19 0358 07/13/19 0647 07/14/19 1310 07/14/19 1821  WBC   < >  --   --  9.9   < > 10.4 11.4* 15.8* 19.1* 26.9*  CREATININE   < >  --   --  1.00  --  1.06 0.91 0.99 1.45*  --   LATICACIDVEN  --  2.3* 2.1*  --   --   --   --   --   --   --    < > = values in this interval not displayed.    Estimated Creatinine Clearance: 48.2 mL/min (A) (by C-G formula based on SCr of 1.45 mg/dL (H)).    Allergies  Allergen Reactions  . Ativan [Lorazepam]     Resp. arrest  . Shellfish Allergy Anaphylaxis    Antimicrobials this admission: Azithro 2/2 CTX 2/2 Doxycycline 2/3 >>  Amp/sulb 2/6 >>   Microbiology results: 2/2 BCx: ngtd 2/2 UCx: ngtd  2/2 covid  + / flu neg 2/2 MRSA PCR: neg  09/11/19, PharmD, BCPS, BCC[employer] Clinical Pharmacist  Please check AMION for all [facility] phone numbers After 10:00 PM, call Main Pharmacy [phone]

## 2019-07-14 NOTE — Procedures (Signed)
Interventional Radiology Procedure Note  Procedure: Placement of a retreivable IVC filter.  Right CFV approach.  Complications: None Recommendations:  - Routine wound care - Do not submerge for 7 days   Signed,  Yvone Neu. Loreta Ave, DO

## 2019-07-14 NOTE — Progress Notes (Signed)
On morning assessment, patient reported pain in left groin abdomen area. Doctor came to bedside and assessed. CT of abdomen done. Patient became agitated around noon and pulled at tubes and lines, also had increased work of breathing and respirations. BP was soft. Paged doctor and RR nurse. Got order for LR bolus. Patient vitals stabilized and taken to IR for IVC. Patient returned but was again hypotensive and had increased work of breathing. Paged doctor. Gave another LR bolus. Stat CBC. Waiting for labs, continue to monitor.

## 2019-07-15 LAB — CULTURE, BLOOD (ROUTINE X 2)
Culture: NO GROWTH
Special Requests: ADEQUATE

## 2019-07-15 MED ORDER — MORPHINE 100MG IN NS 100ML (1MG/ML) PREMIX INFUSION
2.0000 mg/h | INTRAVENOUS | Status: DC
  Administered 2019-07-15 – 2019-07-17 (×2): 2 mg/h via INTRAVENOUS
  Filled 2019-07-15 (×2): qty 100

## 2019-07-15 MED ORDER — LORAZEPAM 2 MG/ML IJ SOLN
1.0000 mg | INTRAMUSCULAR | Status: DC | PRN
  Administered 2019-07-15 – 2019-07-17 (×7): 1 mg via INTRAVENOUS
  Filled 2019-07-15 (×7): qty 1

## 2019-07-15 NOTE — Progress Notes (Signed)
Attempted to call patient's brother [name] at listed number.  Unable to reach.  Message left with number to call back if he would like information.

## 2019-07-15 NOTE — Progress Notes (Signed)
PROGRESS NOTE                                                                                                                                                                                                             Patient Demographics:    [name], is a 79 y.o. male, DOB - 1940/08/22, DIY:[id number]  Outpatient Primary MD for the patient is Raeanne Gathers, MD    LOS - 5  Admit date - 07/30/2019    Chief Complaint  Patient presents with  . Shortness of Breath       Brief Narrative  [name] is a 79 y.o. male with medical history significant of Anxiety, HLD.  Pt presented to ED with SOB.   He was apparently sick for 3 to 4 days before seeking medical attention, upon arrival to the ER his work-up suggested acute hypoxic respiratory failure due to COVID-19 pneumonia, he was also found to have new onset A. fib and RVR.  His hospital stay was complicated by DVT, psoas abscess, aspiration pneumonia, he also developed severe toxic and metabolic encephalopathy, after sustained decline he was transition to full comfort measures on 07/14/2019 night.    Subjective:   Patient in bed, appears to be in respiratory distress and totally confused, mumbling and moaning.   Assessment  & Plan :     At this time patient has been transition to full comfort measures.  He had extensive Covid pneumonia complicated by possibly aspiration pneumonia evening of 07/14/2019, he also developed a large DVT which was again complicated by psoas bleed, finally he developed severe encephalopathy in the setting of ongoing aspiration and continued to decline despite full appropriate medical treatment.  Due to his advanced age, multiple comorbidities and serial decline in medical condition he was transition to full comfort measures night of 07/14/2019.  Goal of care now will be comfort.  He remains DNR.  Expect him to pass away this hospital  stay.      Other medical problems addressed this admission are below.    1. Acute Hypoxic Resp. Failure due to Acute Covid 19 Viral Pneumonitis during the ongoing 2020  Covid 19 Pandemic - he has severe disease, currently on 12 L high flow oxygen, in moderate to severe respiratory distress, extremely elevated CRP and D-dimer despite steroids Eliquis.  At this time I will increase his IV steroids, continue remdesivir, after informed consent he already received Actemra on 07/10/2019.  Continue to monitor clinically, overall condition is tenuous if he declines further he will be an extremely poor candidate for mechanical ventilation or intubation, two-physician DNR has been made, if he declines any further we will focus on full comfort. 3 attempts of contacting brother on 3 consecutive days have failed, detailed messages have been left.  Encouraged the patient to sit up in chair in the daytime use I-S and flutter valve for pulmonary toiletry and then prone in bed when at night.    SpO2: 99 % O2 Flow Rate (L/min): 10 L/min FiO2 (%): 95 %  Recent Labs  Lab 08/04/2019 2305 07/10/19 0012 07/10/19 0022 07/10/19 1009 07/11/19 0531 07/12/19 0358 07/13/19 0647 07/14/19 1310 07/14/19 1340  CRP   < >  --   --  21.1* 13.6* 7.6* 3.1* 1.0*  --   DDIMER   < >  --  >20.00* >20.00* >20.00* >20.00* >20.00* 15.24*  --   FERRITIN  --   --  746* 806* 822*  --   --   --   --   BNP  --   --   --   --  88.7 94.9 77.1  --  75.6  PROCALCITON  --   --  0.24  --  0.19 0.10 <0.10 <0.10  --   SARSCOV2NAA  --  POSITIVE*  --   --   --   --   --   --   --    < > = values in this interval not displayed.    Hepatic Function Latest Ref Rng & Units 07/14/2019 07/13/2019 07/12/2019  Total Protein 6.5 - 8.1 g/dL 5.6(L) 6.8 7.2  Albumin 3.5 - 5.0 g/dL 2.5(L) 3.0(L) 2.8(L)  AST 15 - 41 U/L 61(H) 69(H) 54(H)  ALT 0 - 44 U/L 83(H) 76(H) 68(H)  Alk Phosphatase 38 - 126 U/L 69 87 101  Total Bilirubin 0.3 - 1.2 mg/dL 1.8(H)  1.7(H) 1.5(H)     2.  New onset A. fib RVR [place] vas 2 score of at least 2.  Rate in good control, will place him on oral Cardizem and beta-blocker and titrate off his Cardizem drip.  Full anticoagulation.     Lab Results  Component Value Date   TSH 0.298 (L) 07/12/2019     3.  Acute bilateral lower extremity DVT.  Is on full dose Lovenox but now seems to have developed a left-sided psoas hematoma, hold anticoagulation, monitor H&H and place IVC filter.  4.  Left psoas hematoma.  Hold anticoagulation, IVC filter for DVT.    5. Hypertension.  Currently on Cardizem and beta-blocker will monitor and adjust as needed.  6.  Hypothyroidism.  Will place on methimazole, outpatient endocrine follow-up.  7.  Urinary retention.  Placed on Foley Flomax on 07/13/2019.  8.  Toxic and metabolic encephalopathy.  Haldol as needed, avoid benzodiazepines and narcotics, restraints as needed to prevent self injury.  9.  Aspiration pneumonia diagnosed clinically 07/14/2019 due to rapidly rising CBC, sudden respiratory decline and decline in his mental status.  Failed treatment with Unasyn.     Condition - Extremely Guarded  Family Communication  :   Left message for patient's brother in  detail on 07/11/2019 about poor prognosis, left message again for patient's brother on 07/12/2019 at 11:50 AM.  Message left again on 07/13/2019 at 12 PM again clearly informed that patient most likely will die this admission.  Called brother again on 07/14/2019 at 9 AM and again at 11:25 AM no response.  Code Status : Two-physician DNR made.  Diet :    Diet Order            Diet full liquid Room service appropriate? Yes; Fluid consistency: Thin  Diet effective now               Disposition Plan  : Severely hypoxic on 15 L of oxygen, on full dose Lovenox for DVT and full Covid treatment.  Consults  : Cardiology  Procedures  :    CT angiogram.  No PE  Venous ultrasound legs.  PUD Prophylaxis :  None  DVT  Prophylaxis  :  Lovenox    Lab Results  Component Value Date   PLT 206 07/14/2019    Inpatient Medications  Scheduled Meds: . Chlorhexidine Gluconate Cloth  6 each Topical Daily  . doxycycline  100 mg Oral Q12H  . feeding supplement (ENSURE ENLIVE)  237 mL Oral BID BM  . mouth rinse  15 mL Mouth Rinse BID  . methimazole  5 mg Oral TID  . tamsulosin  0.4 mg Oral Daily   Continuous Infusions: . morphine     PRN Meds:.acetaminophen, guaiFENesin, guaiFENesin-dextromethorphan, haloperidol lactate, lip balm, LORazepam, metoprolol tartrate, sodium chloride, white petrolatum  Antibiotics  :    Anti-infectives (From admission, onward)   Start     Dose/Rate Route Frequency Ordered Stop   07/14/19 1930  Ampicillin-Sulbactam (UNASYN) 3 g in sodium chloride 0.9 % 100 mL IVPB  Status:  Discontinued     3 g 200 mL/hr over 30 Minutes Intravenous Every 6 hours 07/14/19 1919 07/15/19 1023   07/11/19 1345  doxycycline (VIBRA-TABS) tablet 100 mg     100 mg Oral Every 12 hours 07/11/19 1344     07/11/19 1000  remdesivir 100 mg in sodium chloride 0.9 % 100 mL IVPB     100 mg 200 mL/hr over 30 Minutes Intravenous Daily 07/10/19 0223 07/14/19 1126   07/10/19 0300  remdesivir 200 mg in sodium chloride 0.9% 250 mL IVPB     200 mg 580 mL/hr over 30 Minutes Intravenous Once 07/10/19 0223 07/10/19 0551   07/26/2019 2345  cefTRIAXone (ROCEPHIN) 2 g in sodium chloride 0.9 % 100 mL IVPB  Status:  Discontinued     2 g 200 mL/hr over 30 Minutes Intravenous Every 24 hours 07/12/2019 2337 07/10/19 0222   07/25/2019 2345  azithromycin (ZITHROMAX) 500 mg in sodium chloride 0.9 % 250 mL IVPB  Status:  Discontinued     500 mg 250 mL/hr over 60 Minutes Intravenous Every 24 hours 08/04/2019 2337 07/10/19 0222       Time Spent in minutes  30   Lala Lund M.D on 07/15/2019 at 10:30 AM  To page go to [email or web address] - password [facility]  [facility] -  Office  [phone]    See all Orders from today for  further details    Objective:   Vitals:   07/14/19 2200 07/15/19 0300 07/15/19 0700 07/15/19 0727  BP: (!) 92/46 105/77    Pulse: 82 (!) 126    Resp: (!) 43 (!) 31 (!) 26   Temp:    97.7 F (36.5 C)  TempSrc:  Axillary  SpO2: 99% (!) 68%  99%  Weight:      Height:        Wt Readings from Last 3 Encounters:  07/10/19 95.5 kg  07/31/18 90.7 kg  10/17/12 102.1 kg     Intake/Output Summary (Last 24 hours) at 07/15/2019 1030 Last data filed at 07/15/2019 0600 Gross per 24 hour  Intake 915.86 ml  Output 900 ml  Net 15.86 ml     Physical Exam  General in bed, unable to answer questions or follow commands, appears to be in some respiratory distress, Supple Neck,   Symmetrical Chest wall movement, Good air movement bilaterally  RRR,No Gallops, Rubs or new Murmurs, No Parasternal Heave +ve B.Sounds, Abd Soft, No tenderness, No organomegaly appriciated, No rebound - guarding or rigidity. No Cyanosis     Data Review:    CBC Recent Labs  Lab 07/10/19 1009 07/10/19 1009 07/11/19 0531 07/12/19 0358 07/13/19 0647 07/14/19 1310 07/14/19 1821  WBC 9.9   < > 10.4 11.4* 15.8* 19.1* 26.9*  HGB 13.8   < > 13.0 14.1 15.1 12.7* 11.9*  HCT 41.4   < > 38.4* 41.4 44.5 36.6* 35.5*  PLT 135*   < > 134* 128* 147* 203 206  MCV 86.6   < > 85.9 85.9 85.6 84.9 86.8  MCH 28.9   < > 29.1 29.3 29.0 29.5 29.1  MCHC 33.3   < > 33.9 34.1 33.9 34.7 33.5  RDW 13.7   < > 13.7 13.7 13.6 13.3 13.6  LYMPHSABS 0.4*  --  0.6* 0.6* 1.0 1.4  --   MONOABS 0.2  --  0.6 0.4 1.0 1.6*  --   EOSABS 0.0  --  0.0 0.0 0.0 0.0  --   BASOSABS 0.0  --  0.0 0.0 0.0 0.1  --    < > = values in this interval not displayed.    Chemistries  Recent Labs  Lab 07/10/19 1009 07/11/19 0531 07/12/19 0358 07/13/19 0647 07/14/19 1310  NA 141 142 140 137 136  K 3.3* 3.4* 3.9 4.5 4.0  CL 107 108 102 102 103  CO2 21* 24 22 22  19*  GLUCOSE 183* 108* 142* 122* 284*  BUN 32* 35* 40* 35* 36*  CREATININE 1.00 1.06  0.91 0.99 1.45*  CALCIUM 7.7* 7.7* 8.2* 8.1* 7.7*  MG 2.7* 2.7* 2.4 2.3 2.2  AST 62* 52* 54* 69* 61*  ALT 72* 61* 68* 76* 83*  ALKPHOS 88 84 101 87 69  BILITOT 1.2 1.0 1.5* 1.7* 1.8*   ------------------------------------------------------------------------------------------------------------------ No results for input(s): CHOL, HDL, LDLCALC, TRIG, CHOLHDL, LDLDIRECT in the last 72 hours.  Lab Results  Component Value Date   HGBA1C 5.7 (H) 02/27/2012   ------------------------------------------------------------------------------------------------------------------ Recent Labs    07/12/19 1245  TSH 0.298*    Cardiac Enzymes No results for input(s): CKMB, TROPONINI, MYOGLOBIN in the last 168 hours.  Invalid input(s): CK ------------------------------------------------------------------------------------------------------------------    Component Value Date/Time   BNP 75.6 07/14/2019 1340    Micro Results Recent Results (from the past 240 hour(s))  Culture, blood (routine x 2)     Status: None   Collection Time: 07/27/2019 10:14 PM   Specimen: BLOOD  Result Value Ref Range Status   Specimen Description BLOOD RIGHT ANTECUBITAL  Final   Special Requests   Final    BOTTLES DRAWN AEROBIC AND ANAEROBIC Blood Culture results may not be optimal due to an inadequate volume of blood received in culture bottles   Culture  Final    NO GROWTH 5 DAYS Performed at [facility], [place] [address], [place], [place] [address]    Report Status 07/14/2019 FINAL  Final  Culture, blood (routine x 2)     Status: None   Collection Time: 07/10/19 12:12 AM   Specimen: BLOOD RIGHT FOREARM  Result Value Ref Range Status   Specimen Description BLOOD RIGHT FOREARM  Final   Special Requests   Final    BOTTLES DRAWN AEROBIC AND ANAEROBIC Blood Culture adequate volume   Culture   Final    NO GROWTH 5 DAYS Performed at [facility], [place] [address] [place], [place] [address]    Report  Status 07/15/2019 FINAL  Final  Respiratory Panel by RT PCR (Flu A&B, Covid) - Nasopharyngeal Swab     Status: Abnormal   Collection Time: 07/10/19 12:12 AM   Specimen: Nasopharyngeal Swab  Result Value Ref Range Status   SARS Coronavirus 2 by RT PCR POSITIVE (A) NEGATIVE Final    Comment: RESULT CALLED TO, READ BACK BY AND VERIFIED WITH: VENEGAS L, RN AT 0211 ON 07/10/2019 BY SAINVILUS S (NOTE) SARS-CoV-2 target nucleic acids are DETECTED. SARS-CoV-2 RNA is generally detectable in upper respiratory specimens  during the acute phase of infection. Positive results are indicative of the presence of the identified virus, but do not rule out bacterial infection or co-infection with other pathogens not detected by the test. Clinical correlation with patient history and other diagnostic information is necessary to determine patient infection status. The expected result is Negative. Fact Sheet for Patients:  PinkCheek.be Fact Sheet for Healthcare Providers: GravelBags.it This test is not yet approved or cleared by the [place] FDA and  has been authorized for detection and/or diagnosis of SARS-CoV-2 by FDA under an Emergency Use Authorization (EUA).  This EUA will remain in effect (meaning this test  can be used) for the duration of  the COVID-19 declaration under Section 564(b)(1) of the Act, 21 U.S.C. section 360bbb-3(b)(1), unless the authorization is terminated or revoked sooner.    Influenza A by PCR NEGATIVE NEGATIVE Final   Influenza B by PCR NEGATIVE NEGATIVE Final    Comment: (NOTE) The Xpert Xpress SARS-CoV-2/FLU/RSV assay is intended as an aid in  the diagnosis of influenza from Nasopharyngeal swab specimens and  should not be used as a sole basis for treatment. Nasal washings and  aspirates are unacceptable for Xpert Xpress SARS-CoV-2/FLU/RSV  testing. Fact Sheet for  Patients: PinkCheek.be Fact Sheet for Healthcare Providers: GravelBags.it This test is not yet approved or cleared by the [place] FDA and  has been authorized for detection and/or diagnosis of SARS-CoV-2 by  FDA under an Emergency Use Authorization (EUA). This EUA will remain  in effect (meaning this test can be used) for the duration of the  Covid-19 declaration under Section 564(b)(1) of the Act, 21  U.S.C. section 360bbb-3(b)(1), unless the authorization is  terminated or revoked. Performed at [facility], [place] [address], [place], [place] [address]   Urine culture     Status: None   Collection Time: 07/10/19  4:18 AM   Specimen: In/Out Cath Urine  Result Value Ref Range Status   Specimen Description IN/OUT CATH URINE  Final   Special Requests NONE  Final   Culture   Final    NO GROWTH Performed at [facility], [place] [address] [place], [place] [address]    Report Status 07/10/2019 FINAL  Final  MRSA  PCR Screening     Status: None   Collection Time: 07/10/19  5:27 AM   Specimen: Nasal Mucosa; Nasopharyngeal  Result Value Ref Range Status   MRSA by PCR NEGATIVE NEGATIVE Final    Comment:        The GeneXpert MRSA Assay (FDA approved for NASAL specimens only), is one component of a comprehensive MRSA colonization surveillance program. It is not intended to diagnose MRSA infection nor to guide or monitor treatment for MRSA infections. Performed at [facility], [place] [address], [place], [place] [address]     Radiology Reports CT ABDOMEN PELVIS WO CONTRAST  Result Date: 07/14/2019 CLINICAL DATA:  79 year old EXAM: CT ABDOMEN AND PELVIS WITHOUT CONTRAST TECHNIQUE: Multidetector CT imaging of the abdomen and pelvis was performed following the standard protocol without IV contrast. COMPARISON:  11/15/2010. FINDINGS: Lower chest: Peripheral ground-glass airspace opacities throughout the visualized  lung bases. BILATERAL lower lobe bronchiectasis. No pleural effusions. Normal heart size. Three-vessel coronary atherosclerosis. Hepatobiliary: Normal unenhanced appearance of the liver. Gallbladder normal in appearance without calcified gallstones. No biliary ductal dilation. Pancreas: Normal unenhanced appearance. Spleen: Normal unenhanced appearance. Small focus of accessory splenic tissue anterior to the spleen at the hilum. Adrenals/Urinary Tract: Normal appearing adrenal glands. Non-obstructing approximate 12 x 15 mm calculus in a mid calyx of the LEFT kidney. Non-obstructing calculi in adjacent LOWER pole calices of the RIGHT kidney, the largest measuring approximately 12 x 13 mm. No hydronephrosis. Parapelvic renal cysts as noted on the prior CT. Within the limits of the unenhanced technique, no significant focal parenchymal abnormality involving either kidney. Urinary bladder decompressed by Foley catheter, accounting for the gas in the bladder. Stomach/Bowel: Stomach normal in appearance for the degree of distention. Normal-appearing small bowel. Mobile cecum positioned in the RIGHT UPPER QUADRANT of the abdomen. Descending and sigmoid colon diverticulosis without evidence of acute diverticulitis. Remainder of the colon unremarkable. Surgically absent appendix. Vascular/Lymphatic: Moderate aortoiliofemoral atherosclerosis without evidence of aneurysm. No pathologic lymphadenopathy. Reproductive: Moderate to marked enlargement of the prostate gland. Normal seminal vesicles. Other: Large LEFT psoas hematoma extending into the LEFT ileus psoas muscle, measuring maximally approximately 12.3 x 9.6 x 18.8 cm. No evidence of intraperitoneal hemorrhage. Musculoskeletal: Mixed lucency and sclerosis involving the ischia bilaterally and the entire sacrum. Numerous sclerotic lesions in the iliac bones. Schmorl's node in the LOWER endplate of L4 anteriorly. Sclerotic lesion involving the posteroinferior L1 vertebral  body. IMPRESSION: 1. Large LEFT psoas hematoma extending into the LEFT ileus psoas muscle, presumably a spontaneous in the absence of recent endovascular procedures. No evidence of intraperitoneal hemorrhage. 2. Peripheral ground-glass airspace opacities throughout the visualized lung bases bilaterally which are imaging features of COVID-19 pneumonia. Other processes such as influenza pneumonia and organizing pneumonia, as can be seen with drug toxicity and connective tissue disease, can cause a similar imaging pattern. 3. Osseous metastatic disease involving the sacrum, iliac bones and the posteroinferior L1 vertebral body. As there is moderate to marked prostate gland enlargement, metastatic prostate cancer is a consideration. Please correlate with PSA. 4. Descending and sigmoid colon diverticulosis without evidence of acute diverticulitis. 5. Non-obstructing bilateral renal calculi. Aortic Atherosclerosis (ICD10-I70.0). These results will be called to the ordering clinician or representative by the Radiologist Assistant, and communication documented in the PACS or zVision Dashboard. Electronically Signed   By: Evangeline Dakin M.D.   On: 07/14/2019 10:41   CT ANGIO CHEST PE W OR WO CONTRAST  Result Date: 07/10/2019 CLINICAL DATA:  Dyspnea on exertion.  Increasing shortness of breath. COVID-19. EXAM: CT ANGIOGRAPHY CHEST WITH CONTRAST TECHNIQUE: Multidetector CT imaging of the chest was performed using the standard protocol during bolus administration of intravenous contrast. Multiplanar CT image reconstructions and MIPs were obtained to evaluate the vascular anatomy. CONTRAST:  92m OMNIPAQUE IOHEXOL 350 MG/ML SOLN COMPARISON:  Chest x-ray dated 08/04/2019 FINDINGS: Cardiovascular: No pulmonary emboli. Heart size is normal. No pericardial effusion. Aortic atherosclerosis. Mediastinum/Nodes: No enlarged mediastinal, hilar, or axillary lymph nodes. Thyroid gland, trachea, and esophagus demonstrate no significant  findings. Lungs/Pleura: Extensive bilateral primarily peripheral pulmonary infiltrates most severe in the right upper lobe and in both lower lobes. No effusions. Upper Abdomen: No acute abnormality. 13 mm stone in the lower mid left kidney, unchanged since the prior CT scan of the abdomen dated 11/15/2010 Musculoskeletal: No acute abnormality. Osteophytes fuse multiple levels in the lower thoracic spine. Review of the MIP images confirms the above findings. IMPRESSION: 1. No pulmonary emboli. 2. Extensive bilateral primarily peripheral pulmonary infiltrates consistent with pneumonia. Electronically Signed   By: JLorriane ShireM.D.   On: 07/10/2019 11:23   IR IVC FILTER PLMT / S&I /Burke KeelsGUID/MOD SED  Result Date: 07/15/2019 INDICATION: 79year old male with a history of hemorrhage on anticoagulation. EXAM: IMAGE GUIDED PLACEMENT OF RETRIEVABLE IVC FILTER ULTRASOUND GUIDED ACCESS RIGHT COMMON FEMORAL VEIN MEDICATIONS: None. ANESTHESIA/SEDATION: None FLUOROSCOPY TIME:  Fluoroscopy Time: 0 minutes 24 seconds (39 mGy). COMPLICATIONS: 9 none PROCEDURE: The procedure, risks, benefits, and alternatives were explained to the patient. Specific risks discussed include bleeding, infection, contrast reaction, renal failure, IVC filter fracture, migration, iliocaval thrombus (3-4% incidence), need for further procedure, need for further surgery, pulmonary embolism, cardiopulmonary collapse, death. Questions regarding the procedure were encouraged and answered. The patient understands and consents to the procedure. Ultrasound survey was performed with images stored and sent to PACs. The right inguinal region was prepped with chlorhexidine in a sterile fashion, and a sterile drape was applied covering the operative field. A sterile gown and sterile gloves were used for the procedure. Local anesthesia was provided with 1% Lidocaine. A single wall needle was used access the right common femoral vein under ultrasound. With excellent  venous blood flow returned, 035 wire was passed through the needle. Small incision was made with an 11 blade scalpel. The needle was removed, and dilation was performed over the wire. The delivery sheath for a retrievable Bard Denali filter was passed over the Bentson wire into the IVC. The wire was removed and small contrast was used to confirm IVC location. IVC cavagram performed. Dilator was removed, and the IVC filter was then delivered, positioned below the lowest renal vein. Repeat cavagram performed, and the catheter was removed. Manual pressure was used for hemostasis. Patient tolerated the procedure well and remained hemodynamically stable throughout. No complications were encountered and no significant blood loss was encounter. IMPRESSION: Status post ultrasound guided access right common femoral vein 4 deployment of retrievable IVC filter. Signed, JDulcy Fanny WDellia Nims RPVI [facility] GGrant [facility] HospitalRadiology PLAN: This IVC filter is potentially retrievable. The patient will be assessed for filter retrieval by Interventional Radiology in approximately 8-12 weeks. Further recommendations regarding filter retrieval, continued surveillance or declaration of device permanence, will be made at that time. Electronically Signed   By: JCorrie MckusickD.O.   On: 07/15/2019 08:28   DG Chest Port 1 View  Result Date: 07/14/2019 CLINICAL DATA:  Shortness of breath EXAM: PORTABLE CHEST 1 VIEW COMPARISON:  07/14/2019, 5:53 a.m. FINDINGS: The  heart size and mediastinal contours are within normal limits. There is extensive bilateral, predominantly peripheral and basilar heterogeneous airspace disease, similar to prior examination. No new airspace opacity. The visualized skeletal structures are unremarkable. IMPRESSION: No significant interval change in extensive bilateral heterogeneous airspace disease, consistent with multifocal infection. No new airspace opacity. Electronically Signed    By: Eddie Candle M.D.   On: 07/14/2019 20:26   DG Chest Port 1 View  Result Date: 07/14/2019 CLINICAL DATA:  Shortness of breath. COVID positive. EXAM: PORTABLE CHEST 1 VIEW COMPARISON:  07/23/2019 FINDINGS: The cardiomediastinal silhouette is unremarkable. Bilateral airspace opacities are unchanged. No pneumothorax or definite pleural effusion. Little interval change from prior study. IMPRESSION: Unchanged chest radiograph with bilateral airspace opacities. Electronically Signed   By: Margarette Canada M.D.   On: 07/14/2019 10:18   DG Chest Port 1 View  Result Date: 07/28/2019 CLINICAL DATA:  Shortness of breath. EXAM: PORTABLE CHEST 1 VIEW COMPARISON:  July 31, 2018 FINDINGS: Moderate to marked severity infiltrates are seen within the bilateral lung bases and along the periphery of the mid to upper right lung. There is no evidence of a pleural effusion or pneumothorax. The heart size and mediastinal contours are within normal limits. Multilevel degenerative changes seen throughout the thoracic spine. IMPRESSION: 1. Moderate to marked severity bilateral infiltrates, right greater than left. Electronically Signed   By: Virgina Norfolk M.D.   On: 07/25/2019 23:10   VAS [place] LOWER EXTREMITY VENOUS (DVT)  Result Date: 07/12/2019  Lower Venous DVTStudy Indications: Swelling, and Covid+, Elevated D dimer.  Anticoagulation: Lovenox. Comparison Study: No prior exam. Performing Technologist: Baldwin Crown RDMS, RVT  Examination Guidelines: A complete evaluation includes B-mode imaging, spectral Doppler, color Doppler, and power Doppler as needed of all accessible portions of each vessel. Bilateral testing is considered an integral part of a complete examination. Limited examinations for reoccurring indications may be performed as noted. The reflux portion of the exam is performed with the patient in reverse Trendelenburg.  +---------+---------------+---------+-----------+----------+--------------+ RIGHT     CompressibilityPhasicitySpontaneityPropertiesThrombus Aging +---------+---------------+---------+-----------+----------+--------------+ CFV      Full           Yes      Yes                                 +---------+---------------+---------+-----------+----------+--------------+ SFJ      Full                                                        +---------+---------------+---------+-----------+----------+--------------+ FV Prox  Full                                                        +---------+---------------+---------+-----------+----------+--------------+ FV Mid   Full                                                        +---------+---------------+---------+-----------+----------+--------------+ FV DistalFull                                                        +---------+---------------+---------+-----------+----------+--------------+  PFV      Full                                                        +---------+---------------+---------+-----------+----------+--------------+ POP      None           No       No                   Acute          +---------+---------------+---------+-----------+----------+--------------+ PTV      None                                                        +---------+---------------+---------+-----------+----------+--------------+ PERO     None                                                        +---------+---------------+---------+-----------+----------+--------------+ Gastroc  None           No       No                   Acute          +---------+---------------+---------+-----------+----------+--------------+   +---------+---------------+---------+-----------+----------+--------------+ LEFT     CompressibilityPhasicitySpontaneityPropertiesThrombus Aging +---------+---------------+---------+-----------+----------+--------------+ CFV      Full           Yes      Yes                                  +---------+---------------+---------+-----------+----------+--------------+ SFJ      Full                                                        +---------+---------------+---------+-----------+----------+--------------+ FV Prox  Full                                                        +---------+---------------+---------+-----------+----------+--------------+ FV Mid   Full                                                        +---------+---------------+---------+-----------+----------+--------------+ FV DistalFull                                                        +---------+---------------+---------+-----------+----------+--------------+  PFV      Full                                                        +---------+---------------+---------+-----------+----------+--------------+ POP      Full           Yes      Yes                                 +---------+---------------+---------+-----------+----------+--------------+ PTV      None                                                        +---------+---------------+---------+-----------+----------+--------------+ PERO     None                                                        +---------+---------------+---------+-----------+----------+--------------+ Gastroc  None                                         Acute          +---------+---------------+---------+-----------+----------+--------------+     Summary: RIGHT: - Findings consistent with acute deep vein thrombosis involving the right popliteal vein, right posterior tibial veins, right peroneal veins, and right gastrocnemius veins. - No cystic structure found in the popliteal fossa.  LEFT: - Findings consistent with acute deep vein thrombosis involving the left posterior tibial veins, left peroneal veins, and one left gastrocnemius vein. - No cystic structure found in the popliteal fossa.  *See table(s) above for  measurements and observations. Electronically signed by Servando Snare MD on 07/12/2019 at 2:47:28 PM.    Final

## 2019-07-15 NOTE — Progress Notes (Signed)
Attempted to update brother on contact list.

## 2019-07-16 ENCOUNTER — Encounter (HOSPITAL_COMMUNITY): Payer: Self-pay | Admitting: Internal Medicine

## 2019-07-16 NOTE — Plan of Care (Signed)
  Problem: Education: Goal: Knowledge of General Education information will improve Description: Including pain rating scale, medication(s)/side effects and non-pharmacologic comfort measures Outcome: Not Progressing   Problem: Health Behavior/Discharge Planning: Goal: Ability to manage health-related needs will improve Outcome: Not Progressing   Problem: Clinical Measurements: Goal: Ability to maintain clinical measurements within normal limits will improve Outcome: Not Progressing Goal: Will remain free from infection Outcome: Not Progressing Goal: Diagnostic test results will improve Outcome: Not Progressing Goal: Respiratory complications will improve Outcome: Not Progressing Goal: Cardiovascular complication will be avoided Outcome: Not Progressing   Problem: Activity: Goal: Risk for activity intolerance will decrease Outcome: Not Progressing   Problem: Nutrition: Goal: Adequate nutrition will be maintained Outcome: Not Progressing   Problem: Coping: Goal: Level of anxiety will decrease Outcome: Not Progressing   Problem: Elimination: Goal: Will not experience complications related to bowel motility Outcome: Not Progressing Goal: Will not experience complications related to urinary retention Outcome: Not Progressing   Problem: Pain Managment: Goal: General experience of comfort will improve Outcome: Not Progressing   Problem: Safety: Goal: Ability to remain free from injury will improve Outcome: Not Progressing   Problem: Skin Integrity: Goal: Risk for impaired skin integrity will decrease Outcome: Not Progressing   Problem: Education: Goal: Knowledge of risk factors and measures for prevention of condition will improve Outcome: Not Progressing   Problem: Coping: Goal: Psychosocial and spiritual needs will be supported Outcome: Not Progressing   Problem: Respiratory: Goal: Will maintain a patent airway Outcome: Not Progressing Goal: Complications  related to the disease process, condition or treatment will be avoided or minimized Outcome: Not Progressing   Problem: Education: Goal: Knowledge of the prescribed therapeutic regimen will improve Outcome: Not Progressing   Problem: Coping: Goal: Ability to identify and develop effective coping behavior will improve Outcome: Not Progressing   Problem: Clinical Measurements: Goal: Quality of life will improve Outcome: Not Progressing   Problem: Respiratory: Goal: Verbalizations of increased ease of respirations will increase Outcome: Not Progressing   Problem: Pain Management: Goal: Satisfaction with pain management regimen will improve Outcome: Not Progressing

## 2019-07-16 NOTE — Progress Notes (Signed)
Physical Therapy Discharge Patient Details Name: [name] MRN: [id number] DOB: 29-Sep-1940 Today's Date: 07/16/2019 Time:  -     Patient discharged from PT services secondary to patient has transitioned to full comfort care with morphine drip.  Please see latest therapy progress note for current level of functioning and progress toward goals.    Progress and discharge plan discussed with patient and/or caregiver: Patient unable to participate in discharge planning and no caregivers available  GP     Jerolyn Center, PT Pager [phone]  Zena Amos 07/16/2019, 9:37 AM

## 2019-07-16 NOTE — Progress Notes (Addendum)
PROGRESS NOTE                                                                                                                                                                                                             Patient Demographics:    [name], is a 79 y.o. male, DOB - January 02, 1941, LTR:[id number]  Outpatient Primary MD for the patient is Raeanne Gathers, MD    LOS - 6  Admit date - 07/27/2019    Chief Complaint  Patient presents with  . Shortness of Breath       Brief Narrative  [name] is a 79 y.o. male with medical history significant of Anxiety, HLD.  Pt presented to ED with SOB.   He was apparently sick for 3 to 4 days before seeking medical attention, upon arrival to the ER his work-up suggested acute hypoxic respiratory failure due to COVID-19 pneumonia, he was also found to have new onset A. fib and RVR.  His hospital stay was complicated by DVT, psoas abscess, aspiration pneumonia, he also developed severe toxic and metabolic encephalopathy, after sustained decline he was transition to full comfort measures on 07/14/2019 night.    Subjective:   Patient in bed, appears to be resting comfortably on morphine drip   Assessment  & Plan :     At this time patient has been transition to full comfort measures.  He had extensive Covid pneumonia complicated by possibly aspiration pneumonia evening of 07/14/2019, he also developed a large DVT which was again complicated by psoas bleed, finally he developed severe encephalopathy in the setting of ongoing aspiration and continued to decline despite full appropriate medical treatment.  Due to his advanced age, multiple comorbidities and serial decline in medical condition he was transition to full comfort measures night of 07/14/2019.  Goal of care now will be comfort.  He remains DNR.  Expect him to pass away this soon, will try and arrange for residential hospice  if he survives another 24 hours.      Other medical problems addressed this admission are below.    1. Acute Hypoxic Resp. Failure due to Acute  Covid 19 Viral Pneumonitis during the ongoing 2020 Covid 19 Pandemic - he has severe disease, currently on 12 L high flow oxygen, in moderate to severe respiratory distress, extremely elevated CRP and D-dimer despite steroids Eliquis.  At this time I will increase his IV steroids, continue remdesivir, after informed consent he already received Actemra on 07/10/2019.  Continue to monitor clinically, overall condition is tenuous if he declines further he will be an extremely poor candidate for mechanical ventilation or intubation, two-physician DNR has been made, if he declines any further we will focus on full comfort. 3 attempts of contacting brother on 3 consecutive days have failed, detailed messages have been left.  Encouraged the patient to sit up in chair in the daytime use I-S and flutter valve for pulmonary toiletry and then prone in bed when at night.    SpO2: 97 % O2 Flow Rate (L/min): 2 L/min FiO2 (%): 95 %  Recent Labs  Lab 07/21/2019 2305 07/10/19 0012 07/10/19 0022 07/10/19 1009 07/11/19 0531 07/12/19 0358 07/13/19 0647 07/14/19 1310 07/14/19 1340  CRP   < >  --   --  21.1* 13.6* 7.6* 3.1* 1.0*  --   DDIMER   < >  --  >20.00* >20.00* >20.00* >20.00* >20.00* 15.24*  --   FERRITIN  --   --  746* 806* 822*  --   --   --   --   BNP  --   --   --   --  88.7 94.9 77.1  --  75.6  PROCALCITON  --   --  0.24  --  0.19 0.10 <0.10 <0.10  --   SARSCOV2NAA  --  POSITIVE*  --   --   --   --   --   --   --    < > = values in this interval not displayed.    Hepatic Function Latest Ref Rng & Units 07/14/2019 07/13/2019 07/12/2019  Total Protein 6.5 - 8.1 g/dL 5.6(L) 6.8 7.2  Albumin 3.5 - 5.0 g/dL 2.5(L) 3.0(L) 2.8(L)  AST 15 - 41 U/L 61(H) 69(H) 54(H)  ALT 0 - 44 U/L 83(H) 76(H) 68(H)  Alk Phosphatase 38 - 126 U/L 69 87 101  Total Bilirubin  0.3 - 1.2 mg/dL 1.8(H) 1.7(H) 1.5(H)     2.  New onset A. fib RVR [place] vas 2 score of at least 2.  Rate in good control, will place him on oral Cardizem and beta-blocker and titrate off his Cardizem drip.  Full anticoagulation.     Lab Results  Component Value Date   TSH 0.298 (L) 07/12/2019     3.  Acute bilateral lower extremity DVT.  Is on full dose Lovenox but now seems to have developed a left-sided psoas hematoma, hold anticoagulation, monitor H&H and place IVC filter.  4.  Left psoas hematoma.  Hold anticoagulation, IVC filter for DVT.    5. Hypertension.  Currently on Cardizem and beta-blocker will monitor and adjust as needed.  6.  Hypothyroidism.  Will place on methimazole, outpatient endocrine follow-up.  7.  Urinary retention.  Placed on Foley Flomax on 07/13/2019.  8.  Toxic and metabolic encephalopathy.  Haldol as needed, avoid benzodiazepines and narcotics, restraints as needed to prevent self injury.  9.  Aspiration pneumonia diagnosed clinically 07/14/2019 due to rapidly rising CBC, sudden respiratory decline and decline in his mental status.  Failed treatment with Unasyn.     Condition - Extremely Guarded  Family Communication  :  Left message for patient's brother in detail on 07/11/2019 about poor prognosis, left message again for patient's brother on 07/12/2019 at 11:50 AM.  Message left again on 07/13/2019 at 12 PM again clearly informed that patient most likely will die this admission.  Called brother again on 07/14/2019 at 9 AM and again at 11:25 AM no response, 07/15/19 - no response.  Code Status : Two-physician DNR made.  Diet :    Diet Order            Diet full liquid Room service appropriate? Yes; Fluid consistency: Thin  Diet effective now               Disposition Plan  : On full comfort care morphine drip, requested residential hospice  Consults  : Cardiology  Procedures  :    CT angiogram.  No PE  Venous ultrasound legs.  PUD Prophylaxis :   None  DVT Prophylaxis  :  Lovenox    Lab Results  Component Value Date   PLT 206 07/14/2019    Inpatient Medications  Scheduled Meds: . Chlorhexidine Gluconate Cloth  6 each Topical Daily  . doxycycline  100 mg Oral Q12H  . feeding supplement (ENSURE ENLIVE)  237 mL Oral BID BM  . mouth rinse  15 mL Mouth Rinse BID  . methimazole  5 mg Oral TID  . tamsulosin  0.4 mg Oral Daily   Continuous Infusions: . morphine 2 mg/hr (07/15/19 1137)   PRN Meds:.acetaminophen, guaiFENesin, guaiFENesin-dextromethorphan, haloperidol lactate, lip balm, LORazepam, metoprolol tartrate, sodium chloride, white petrolatum  Antibiotics  :    Anti-infectives (From admission, onward)   Start     Dose/Rate Route Frequency Ordered Stop   07/14/19 1930  Ampicillin-Sulbactam (UNASYN) 3 g in sodium chloride 0.9 % 100 mL IVPB  Status:  Discontinued     3 g 200 mL/hr over 30 Minutes Intravenous Every 6 hours 07/14/19 1919 07/15/19 1023   07/11/19 1345  doxycycline (VIBRA-TABS) tablet 100 mg     100 mg Oral Every 12 hours 07/11/19 1344     07/11/19 1000  remdesivir 100 mg in sodium chloride 0.9 % 100 mL IVPB     100 mg 200 mL/hr over 30 Minutes Intravenous Daily 07/10/19 0223 07/14/19 1126   07/10/19 0300  remdesivir 200 mg in sodium chloride 0.9% 250 mL IVPB     200 mg 580 mL/hr over 30 Minutes Intravenous Once 07/10/19 0223 07/10/19 0551   07/30/2019 2345  cefTRIAXone (ROCEPHIN) 2 g in sodium chloride 0.9 % 100 mL IVPB  Status:  Discontinued     2 g 200 mL/hr over 30 Minutes Intravenous Every 24 hours 07/30/2019 2337 07/10/19 0222   07/21/2019 2345  azithromycin (ZITHROMAX) 500 mg in sodium chloride 0.9 % 250 mL IVPB  Status:  Discontinued     500 mg 250 mL/hr over 60 Minutes Intravenous Every 24 hours 08/02/2019 2337 07/10/19 0222       Time Spent in minutes  30   Lala Lund M.D on 07/16/2019 at 9:25 AM  To page go to [email or web address] - password [facility]  [facility] -  Office  [phone]      See all Orders from today for further details    Objective:   Vitals:   07/15/19 2054 07/16/19 0000 07/16/19 0400 07/16/19 0415  BP:   123/76   Pulse:  (!) 102 (!) 117   Resp:   (!) 22   Temp:   97.7 F (36.5 C)  TempSrc:   Axillary   SpO2: 92% 100% 97%   Weight:    89.4 kg  Height:        Wt Readings from Last 3 Encounters:  07/16/19 89.4 kg  07/31/18 90.7 kg  10/17/12 102.1 kg     Intake/Output Summary (Last 24 hours) at 07/16/2019 0925 Last data filed at 07/16/2019 9811 Gross per 24 hour  Intake 0 ml  Output 1200 ml  Net -1200 ml     Physical Exam  General in bed, unable to answer questions or follow commands, appears to to be less short of breath and resting comfortably, Supple Neck,   Symmetrical Chest wall movement, Good air movement bilaterally  RRR,No Gallops, Rubs or new Murmurs, No Parasternal Heave +ve B.Sounds, Abd Soft, No tenderness, No organomegaly appriciated, No rebound - guarding or rigidity. No Cyanosis     Data Review:    CBC Recent Labs  Lab 07/10/19 1009 07/10/19 1009 07/11/19 0531 07/12/19 0358 07/13/19 0647 07/14/19 1310 07/14/19 1821  WBC 9.9   < > 10.4 11.4* 15.8* 19.1* 26.9*  HGB 13.8   < > 13.0 14.1 15.1 12.7* 11.9*  HCT 41.4   < > 38.4* 41.4 44.5 36.6* 35.5*  PLT 135*   < > 134* 128* 147* 203 206  MCV 86.6   < > 85.9 85.9 85.6 84.9 86.8  MCH 28.9   < > 29.1 29.3 29.0 29.5 29.1  MCHC 33.3   < > 33.9 34.1 33.9 34.7 33.5  RDW 13.7   < > 13.7 13.7 13.6 13.3 13.6  LYMPHSABS 0.4*  --  0.6* 0.6* 1.0 1.4  --   MONOABS 0.2  --  0.6 0.4 1.0 1.6*  --   EOSABS 0.0  --  0.0 0.0 0.0 0.0  --   BASOSABS 0.0  --  0.0 0.0 0.0 0.1  --    < > = values in this interval not displayed.    Chemistries  Recent Labs  Lab 07/10/19 1009 07/11/19 0531 07/12/19 0358 07/13/19 0647 07/14/19 1310  NA 141 142 140 137 136  K 3.3* 3.4* 3.9 4.5 4.0  CL 107 108 102 102 103  CO2 21* 24 22 22  19*  GLUCOSE 183* 108* 142* 122* 284*  BUN 32* 35*  40* 35* 36*  CREATININE 1.00 1.06 0.91 0.99 1.45*  CALCIUM 7.7* 7.7* 8.2* 8.1* 7.7*  MG 2.7* 2.7* 2.4 2.3 2.2  AST 62* 52* 54* 69* 61*  ALT 72* 61* 68* 76* 83*  ALKPHOS 88 84 101 87 69  BILITOT 1.2 1.0 1.5* 1.7* 1.8*   ------------------------------------------------------------------------------------------------------------------ No results for input(s): CHOL, HDL, LDLCALC, TRIG, CHOLHDL, LDLDIRECT in the last 72 hours.  Lab Results  Component Value Date   HGBA1C 5.7 (H) 02/27/2012   ------------------------------------------------------------------------------------------------------------------ No results for input(s): TSH, T4TOTAL, T3FREE, THYROIDAB in the last 72 hours.  Invalid input(s): FREET3  Cardiac Enzymes No results for input(s): CKMB, TROPONINI, MYOGLOBIN in the last 168 hours.  Invalid input(s): CK ------------------------------------------------------------------------------------------------------------------    Component Value Date/Time   BNP 75.6 07/14/2019 1340    Micro Results Recent Results (from the past 240 hour(s))  Culture, blood (routine x 2)     Status: None   Collection Time: 07/23/2019 10:14 PM   Specimen: BLOOD  Result Value Ref Range Status   Specimen Description BLOOD RIGHT ANTECUBITAL  Final   Special Requests   Final    BOTTLES DRAWN AEROBIC AND ANAEROBIC Blood Culture results may not be optimal due  to an inadequate volume of blood received in culture bottles   Culture   Final    NO GROWTH 5 DAYS Performed at [facility], [place] [address], [place], [place] [address]    Report Status 07/14/2019 FINAL  Final  Culture, blood (routine x 2)     Status: None   Collection Time: 07/10/19 12:12 AM   Specimen: BLOOD RIGHT FOREARM  Result Value Ref Range Status   Specimen Description BLOOD RIGHT FOREARM  Final   Special Requests   Final    BOTTLES DRAWN AEROBIC AND ANAEROBIC Blood Culture adequate volume   Culture   Final    NO GROWTH 5  DAYS Performed at [facility], [place] [address], [place], [place] [address]    Report Status 07/15/2019 FINAL  Final  Respiratory Panel by RT PCR (Flu A&B, Covid) - Nasopharyngeal Swab     Status: Abnormal   Collection Time: 07/10/19 12:12 AM   Specimen: Nasopharyngeal Swab  Result Value Ref Range Status   SARS Coronavirus 2 by RT PCR POSITIVE (A) NEGATIVE Final    Comment: RESULT CALLED TO, READ BACK BY AND VERIFIED WITH: VENEGAS L, RN AT 0211 ON 07/10/2019 BY SAINVILUS S (NOTE) SARS-CoV-2 target nucleic acids are DETECTED. SARS-CoV-2 RNA is generally detectable in upper respiratory specimens  during the acute phase of infection. Positive results are indicative of the presence of the identified virus, but do not rule out bacterial infection or co-infection with other pathogens not detected by the test. Clinical correlation with patient history and other diagnostic information is necessary to determine patient infection status. The expected result is Negative. Fact Sheet for Patients:  PinkCheek.be Fact Sheet for Healthcare Providers: GravelBags.it This test is not yet approved or cleared by the [place] FDA and  has been authorized for detection and/or diagnosis of SARS-CoV-2 by FDA under an Emergency Use Authorization (EUA).  This EUA will remain in effect (meaning this test  can be used) for the duration of  the COVID-19 declaration under Section 564(b)(1) of the Act, 21 U.S.C. section 360bbb-3(b)(1), unless the authorization is terminated or revoked sooner.    Influenza A by PCR NEGATIVE NEGATIVE Final   Influenza B by PCR NEGATIVE NEGATIVE Final    Comment: (NOTE) The Xpert Xpress SARS-CoV-2/FLU/RSV assay is intended as an aid in  the diagnosis of influenza from Nasopharyngeal swab specimens and  should not be used as a sole basis for treatment. Nasal washings and  aspirates are unacceptable for Xpert Xpress  SARS-CoV-2/FLU/RSV  testing. Fact Sheet for Patients: PinkCheek.be Fact Sheet for Healthcare Providers: GravelBags.it This test is not yet approved or cleared by the [place] FDA and  has been authorized for detection and/or diagnosis of SARS-CoV-2 by  FDA under an Emergency Use Authorization (EUA). This EUA will remain  in effect (meaning this test can be used) for the duration of the  Covid-19 declaration under Section 564(b)(1) of the Act, 21  U.S.C. section 360bbb-3(b)(1), unless the authorization is  terminated or revoked. Performed at [facility], [place] [address] [place], [place] [address]   Urine culture     Status: None   Collection Time: 07/10/19  4:18 AM   Specimen: In/Out Cath Urine  Result Value Ref Range Status   Specimen Description IN/OUT CATH URINE  Final   Special Requests NONE  Final   Culture   Final    NO GROWTH Performed at [facility], [place]  [address], [place], [place] [address]    Report Status 07/10/2019 FINAL  Final  MRSA PCR Screening     Status: None   Collection Time: 07/10/19  5:27 AM   Specimen: Nasal Mucosa; Nasopharyngeal  Result Value Ref Range Status   MRSA by PCR NEGATIVE NEGATIVE Final    Comment:        The GeneXpert MRSA Assay (FDA approved for NASAL specimens only), is one component of a comprehensive MRSA colonization surveillance program. It is not intended to diagnose MRSA infection nor to guide or monitor treatment for MRSA infections. Performed at [facility], [place] [address], [place], [place] [address]     Radiology Reports CT ABDOMEN PELVIS WO CONTRAST  Result Date: 07/14/2019 CLINICAL DATA:  79 year old EXAM: CT ABDOMEN AND PELVIS WITHOUT CONTRAST TECHNIQUE: Multidetector CT imaging of the abdomen and pelvis was performed following the standard protocol without IV contrast. COMPARISON:  11/15/2010. FINDINGS: Lower chest: Peripheral  ground-glass airspace opacities throughout the visualized lung bases. BILATERAL lower lobe bronchiectasis. No pleural effusions. Normal heart size. Three-vessel coronary atherosclerosis. Hepatobiliary: Normal unenhanced appearance of the liver. Gallbladder normal in appearance without calcified gallstones. No biliary ductal dilation. Pancreas: Normal unenhanced appearance. Spleen: Normal unenhanced appearance. Small focus of accessory splenic tissue anterior to the spleen at the hilum. Adrenals/Urinary Tract: Normal appearing adrenal glands. Non-obstructing approximate 12 x 15 mm calculus in a mid calyx of the LEFT kidney. Non-obstructing calculi in adjacent LOWER pole calices of the RIGHT kidney, the largest measuring approximately 12 x 13 mm. No hydronephrosis. Parapelvic renal cysts as noted on the prior CT. Within the limits of the unenhanced technique, no significant focal parenchymal abnormality involving either kidney. Urinary bladder decompressed by Foley catheter, accounting for the gas in the bladder. Stomach/Bowel: Stomach normal in appearance for the degree of distention. Normal-appearing small bowel. Mobile cecum positioned in the RIGHT UPPER QUADRANT of the abdomen. Descending and sigmoid colon diverticulosis without evidence of acute diverticulitis. Remainder of the colon unremarkable. Surgically absent appendix. Vascular/Lymphatic: Moderate aortoiliofemoral atherosclerosis without evidence of aneurysm. No pathologic lymphadenopathy. Reproductive: Moderate to marked enlargement of the prostate gland. Normal seminal vesicles. Other: Large LEFT psoas hematoma extending into the LEFT ileus psoas muscle, measuring maximally approximately 12.3 x 9.6 x 18.8 cm. No evidence of intraperitoneal hemorrhage. Musculoskeletal: Mixed lucency and sclerosis involving the ischia bilaterally and the entire sacrum. Numerous sclerotic lesions in the iliac bones. Schmorl's node in the LOWER endplate of L4 anteriorly.  Sclerotic lesion involving the posteroinferior L1 vertebral body. IMPRESSION: 1. Large LEFT psoas hematoma extending into the LEFT ileus psoas muscle, presumably a spontaneous in the absence of recent endovascular procedures. No evidence of intraperitoneal hemorrhage. 2. Peripheral ground-glass airspace opacities throughout the visualized lung bases bilaterally which are imaging features of COVID-19 pneumonia. Other processes such as influenza pneumonia and organizing pneumonia, as can be seen with drug toxicity and connective tissue disease, can cause a similar imaging pattern. 3. Osseous metastatic disease involving the sacrum, iliac bones and the posteroinferior L1 vertebral body. As there is moderate to marked prostate gland enlargement, metastatic prostate cancer is a consideration. Please correlate with PSA. 4. Descending and sigmoid colon diverticulosis without evidence of acute diverticulitis. 5. Non-obstructing bilateral renal calculi. Aortic Atherosclerosis (ICD10-I70.0). These results will be called to the ordering clinician or representative by the Radiologist Assistant, and communication documented in the PACS or zVision Dashboard. Electronically Signed   By: Evangeline Dakin M.D.   On: 07/14/2019 10:41   CT ANGIO CHEST  PE W OR WO CONTRAST  Result Date: 07/10/2019 CLINICAL DATA:  Dyspnea on exertion. Increasing shortness of breath. COVID-19. EXAM: CT ANGIOGRAPHY CHEST WITH CONTRAST TECHNIQUE: Multidetector CT imaging of the chest was performed using the standard protocol during bolus administration of intravenous contrast. Multiplanar CT image reconstructions and MIPs were obtained to evaluate the vascular anatomy. CONTRAST:  51m OMNIPAQUE IOHEXOL 350 MG/ML SOLN COMPARISON:  Chest x-ray dated 07/14/2019 FINDINGS: Cardiovascular: No pulmonary emboli. Heart size is normal. No pericardial effusion. Aortic atherosclerosis. Mediastinum/Nodes: No enlarged mediastinal, hilar, or axillary lymph nodes.  Thyroid gland, trachea, and esophagus demonstrate no significant findings. Lungs/Pleura: Extensive bilateral primarily peripheral pulmonary infiltrates most severe in the right upper lobe and in both lower lobes. No effusions. Upper Abdomen: No acute abnormality. 13 mm stone in the lower mid left kidney, unchanged since the prior CT scan of the abdomen dated 11/15/2010 Musculoskeletal: No acute abnormality. Osteophytes fuse multiple levels in the lower thoracic spine. Review of the MIP images confirms the above findings. IMPRESSION: 1. No pulmonary emboli. 2. Extensive bilateral primarily peripheral pulmonary infiltrates consistent with pneumonia. Electronically Signed   By: JLorriane ShireM.D.   On: 07/10/2019 11:23   IR IVC FILTER PLMT / S&I /Burke KeelsGUID/MOD SED  Result Date: 07/15/2019 INDICATION: 79year old male with a history of hemorrhage on anticoagulation. EXAM: IMAGE GUIDED PLACEMENT OF RETRIEVABLE IVC FILTER ULTRASOUND GUIDED ACCESS RIGHT COMMON FEMORAL VEIN MEDICATIONS: None. ANESTHESIA/SEDATION: None FLUOROSCOPY TIME:  Fluoroscopy Time: 0 minutes 24 seconds (39 mGy). COMPLICATIONS: 9 none PROCEDURE: The procedure, risks, benefits, and alternatives were explained to the patient. Specific risks discussed include bleeding, infection, contrast reaction, renal failure, IVC filter fracture, migration, iliocaval thrombus (3-4% incidence), need for further procedure, need for further surgery, pulmonary embolism, cardiopulmonary collapse, death. Questions regarding the procedure were encouraged and answered. The patient understands and consents to the procedure. Ultrasound survey was performed with images stored and sent to PACs. The right inguinal region was prepped with chlorhexidine in a sterile fashion, and a sterile drape was applied covering the operative field. A sterile gown and sterile gloves were used for the procedure. Local anesthesia was provided with 1% Lidocaine. A single wall needle was used  access the right common femoral vein under ultrasound. With excellent venous blood flow returned, 035 wire was passed through the needle. Small incision was made with an 11 blade scalpel. The needle was removed, and dilation was performed over the wire. The delivery sheath for a retrievable Bard Denali filter was passed over the Bentson wire into the IVC. The wire was removed and small contrast was used to confirm IVC location. IVC cavagram performed. Dilator was removed, and the IVC filter was then delivered, positioned below the lowest renal vein. Repeat cavagram performed, and the catheter was removed. Manual pressure was used for hemostasis. Patient tolerated the procedure well and remained hemodynamically stable throughout. No complications were encountered and no significant blood loss was encounter. IMPRESSION: Status post ultrasound guided access right common femoral vein 4 deployment of retrievable IVC filter. Signed, JDulcy Fanny WDellia Nims RPVI [facility] PLAN: This IVC filter is potentially retrievable. The patient will be assessed for filter retrieval by Interventional Radiology in approximately 8-12 weeks. Further recommendations regarding filter retrieval, continued surveillance or declaration of device permanence, will be made at that time. Electronically Signed   By: JCorrie MckusickD.O.   On: 07/15/2019 08:28   DG Chest Port 1 View  Result Date: 07/14/2019 CLINICAL DATA:  Shortness of breath EXAM: PORTABLE CHEST 1 VIEW COMPARISON:  07/14/2019, 5:53 a.m. FINDINGS: The heart size and mediastinal contours are within normal limits. There is extensive bilateral, predominantly peripheral and basilar heterogeneous airspace disease, similar to prior examination. No new airspace opacity. The visualized skeletal structures are unremarkable. IMPRESSION: No significant interval change in extensive bilateral heterogeneous airspace disease, consistent  with multifocal infection. No new airspace opacity. Electronically Signed   By: Eddie Candle M.D.   On: 07/14/2019 20:26   DG Chest Port 1 View  Result Date: 07/14/2019 CLINICAL DATA:  Shortness of breath. COVID positive. EXAM: PORTABLE CHEST 1 VIEW COMPARISON:  07/12/2019 FINDINGS: The cardiomediastinal silhouette is unremarkable. Bilateral airspace opacities are unchanged. No pneumothorax or definite pleural effusion. Little interval change from prior study. IMPRESSION: Unchanged chest radiograph with bilateral airspace opacities. Electronically Signed   By: Margarette Canada M.D.   On: 07/14/2019 10:18   DG Chest Port 1 View  Result Date: 07/26/2019 CLINICAL DATA:  Shortness of breath. EXAM: PORTABLE CHEST 1 VIEW COMPARISON:  July 31, 2018 FINDINGS: Moderate to marked severity infiltrates are seen within the bilateral lung bases and along the periphery of the mid to upper right lung. There is no evidence of a pleural effusion or pneumothorax. The heart size and mediastinal contours are within normal limits. Multilevel degenerative changes seen throughout the thoracic spine. IMPRESSION: 1. Moderate to marked severity bilateral infiltrates, right greater than left. Electronically Signed   By: Virgina Norfolk M.D.   On: 08/03/2019 23:10   VAS [place] LOWER EXTREMITY VENOUS (DVT)  Result Date: 07/12/2019  Lower Venous DVTStudy Indications: Swelling, and Covid+, Elevated D dimer.  Anticoagulation: Lovenox. Comparison Study: No prior exam. Performing Technologist: Baldwin Crown RDMS, RVT  Examination Guidelines: A complete evaluation includes B-mode imaging, spectral Doppler, color Doppler, and power Doppler as needed of all accessible portions of each vessel. Bilateral testing is considered an integral part of a complete examination. Limited examinations for reoccurring indications may be performed as noted. The reflux portion of the exam is performed with the patient in reverse Trendelenburg.   +---------+---------------+---------+-----------+----------+--------------+ RIGHT    CompressibilityPhasicitySpontaneityPropertiesThrombus Aging +---------+---------------+---------+-----------+----------+--------------+ CFV      Full           Yes      Yes                                 +---------+---------------+---------+-----------+----------+--------------+ SFJ      Full                                                        +---------+---------------+---------+-----------+----------+--------------+ FV Prox  Full                                                        +---------+---------------+---------+-----------+----------+--------------+ FV Mid   Full                                                        +---------+---------------+---------+-----------+----------+--------------+  FV DistalFull                                                        +---------+---------------+---------+-----------+----------+--------------+ PFV      Full                                                        +---------+---------------+---------+-----------+----------+--------------+ POP      None           No       No                   Acute          +---------+---------------+---------+-----------+----------+--------------+ PTV      None                                                        +---------+---------------+---------+-----------+----------+--------------+ PERO     None                                                        +---------+---------------+---------+-----------+----------+--------------+ Gastroc  None           No       No                   Acute          +---------+---------------+---------+-----------+----------+--------------+   +---------+---------------+---------+-----------+----------+--------------+ LEFT     CompressibilityPhasicitySpontaneityPropertiesThrombus Aging  +---------+---------------+---------+-----------+----------+--------------+ CFV      Full           Yes      Yes                                 +---------+---------------+---------+-----------+----------+--------------+ SFJ      Full                                                        +---------+---------------+---------+-----------+----------+--------------+ FV Prox  Full                                                        +---------+---------------+---------+-----------+----------+--------------+ FV Mid   Full                                                        +---------+---------------+---------+-----------+----------+--------------+  FV DistalFull                                                        +---------+---------------+---------+-----------+----------+--------------+ PFV      Full                                                        +---------+---------------+---------+-----------+----------+--------------+ POP      Full           Yes      Yes                                 +---------+---------------+---------+-----------+----------+--------------+ PTV      None                                                        +---------+---------------+---------+-----------+----------+--------------+ PERO     None                                                        +---------+---------------+---------+-----------+----------+--------------+ Gastroc  None                                         Acute          +---------+---------------+---------+-----------+----------+--------------+     Summary: RIGHT: - Findings consistent with acute deep vein thrombosis involving the right popliteal vein, right posterior tibial veins, right peroneal veins, and right gastrocnemius veins. - No cystic structure found in the popliteal fossa.  LEFT: - Findings consistent with acute deep vein thrombosis involving the left posterior tibial veins, left  peroneal veins, and one left gastrocnemius vein. - No cystic structure found in the popliteal fossa.  *See table(s) above for measurements and observations. Electronically signed by Servando Snare MD on 07/12/2019 at 2:47:28 PM.    Final

## 2019-07-16 NOTE — Progress Notes (Signed)
Patients phone ringing off and on, found the number to [employer], attempted to call but no answer. Number is [phone] and [phone]

## 2019-07-16 NOTE — Progress Notes (Signed)
Occupational Therapy Discharge Patient Details Name: [name] MRN: [id number] DOB: 1940-10-03 Today's Date: 07/16/2019 Time:  -     Patient discharged from OT services secondary to medical decline.  Please see latest therapy progress note for current level of functioning and progress toward goals.   Pt has progressed to full comfort care with plan to transition to residential hospice.  GO    Eber Jones., OTR/L Acute Rehabilitation Services Pager [phone] Office [phone]   Jeani Hawking M 07/16/2019, 4:33 PM

## 2019-07-16 NOTE — Progress Notes (Signed)
SLP Cancellation and D/C Note  Patient Details Name: BUELL PARCEL MRN: [id number] DOB: [birth year]   Cancelled treatment:        Pt has transitioned to full comfort care.  Our service will respectfully sign off.     Blenda Mounts Laurice MA CCC/SLP 07/16/2019, 11:26 AM

## 2019-07-16 NOTE — Care Management (Signed)
CM acknowledges consult for residentail hospice.  CM gave referral to both [facility] and [facility] to determine bed offers prior to choice being given - both have covid positive residential beds one in [place] and one in [place]  Attending has not been able to reach pts brother (only contact listed in Epic).  CM also attempted to reach brother but was unsuccessful.  Residential hospice requires for someone to sign required paperwork - attending aware

## 2019-07-16 NOTE — Progress Notes (Signed)
Updated patients son CJ updated on father. [phone]

## 2019-07-16 NOTE — Plan of Care (Signed)
  Problem: Clinical Measurements: Goal: Respiratory complications will improve Outcome: Progressing Goal: Cardiovascular complication will be avoided Outcome: Progressing   Problem: Coping: Goal: Level of anxiety will decrease Outcome: Progressing   Problem: Elimination: Goal: Will not experience complications related to bowel motility Outcome: Progressing Goal: Will not experience complications related to urinary retention Outcome: Progressing   Problem: Pain Managment: Goal: General experience of comfort will improve Outcome: Progressing   Problem: Safety: Goal: Ability to remain free from injury will improve Outcome: Progressing   Problem: Skin Integrity: Goal: Risk for impaired skin integrity will decrease Outcome: Progressing   Problem: Coping: Goal: Psychosocial and spiritual needs will be supported Outcome: Progressing   Problem: Respiratory: Goal: Will maintain a patent airway Outcome: Progressing Goal: Complications related to the disease process, condition or treatment will be avoided or minimized Outcome: Progressing   Problem: Coping: Goal: Ability to identify and develop effective coping behavior will improve Outcome: Progressing   Problem: Clinical Measurements: Goal: Quality of life will improve Outcome: Progressing   Problem: Pain Management: Goal: Satisfaction with pain management regimen will improve Outcome: Progressing

## 2019-07-16 NOTE — Progress Notes (Addendum)
This RN received a call from [name] and [name], patient's brother and sister-in-law. Phone number in the chart is incorrect. Correct phone number is [phone] for [name], or [phone] for [place]. Family updated on patient status. Family informed that the can visit patient for 15 minutes. Family to discuss situation and plan. Son is attempting to visit from [place].

## 2019-07-17 ENCOUNTER — Inpatient Hospital Stay (HOSPITAL_COMMUNITY): Payer: Medicare Other

## 2019-07-17 MED ORDER — LORAZEPAM 2 MG/ML PO CONC: 1.0000 mg | Freq: Four times a day (QID) | ORAL | 0 refills | Status: AC | PRN

## 2019-07-17 MED ORDER — MORPHINE SULFATE (CONCENTRATE) 10 MG/0.5ML PO SOLN: 10.0000 mg | ORAL | 0 refills | Status: AC | PRN

## 2019-07-17 MED ORDER — SODIUM CHLORIDE 0.9 % IV SOLN
INTRAVENOUS | Status: DC | PRN
  Administered 2019-07-17: 250 mL via INTRAVENOUS

## 2019-07-17 MED ORDER — ACETAMINOPHEN 650 MG RE SUPP
650.0000 mg | RECTAL | Status: DC | PRN
  Administered 2019-07-17: 650 mg via RECTAL
  Filled 2019-07-17: qty 1

## 2019-07-17 MED ORDER — CHLORHEXIDINE GLUCONATE 0.12 % MT SOLN
15.0000 mL | Freq: Two times a day (BID) | OROMUCOSAL | Status: DC
  Administered 2019-07-17 (×2): 15 mL via OROMUCOSAL
  Filled 2019-07-17: qty 15

## 2019-08-06 NOTE — Progress Notes (Addendum)
PROGRESS NOTE                                                                                                                                                                                                             Patient Demographics:    [name], is a 79 y.o. male, DOB - 27-Dec-1940, DDU:[id number]  Outpatient Primary MD for the patient is Raeanne Gathers, MD    LOS - 7  Admit date - 07/24/2019    Chief Complaint  Patient presents with  . Shortness of Breath       Brief Narrative  [name] is a 79 y.o. male with medical history significant of Anxiety, HLD.  Pt presented to ED with SOB.   He was apparently sick for 3 to 4 days before seeking medical attention, upon arrival to the ER his work-up suggested acute hypoxic respiratory failure due to COVID-19 pneumonia, he was also found to have new onset A. fib and RVR.  His hospital stay was complicated by DVT, psoas abscess, aspiration pneumonia, he also developed severe toxic and metabolic encephalopathy, after sustained decline he was transition to full comfort measures on 07/14/2019 night.    Subjective:   Patient in bed, appears to be resting comfortably on morphine drip   Assessment  & Plan :     At this time patient has been transition to full comfort measures.  He had extensive Covid pneumonia complicated by possibly aspiration pneumonia evening of 07/14/2019, he also developed a large DVT which was again complicated by psoas bleed, finally he developed severe encephalopathy in the setting of ongoing aspiration and continued to decline despite full appropriate medical treatment.  Due to his advanced age, multiple comorbidities and serial decline in medical condition he was transition to full comfort measures night of 07/14/2019.  Goal of care now will be comfort.  He remains DNR.  Expect him to pass away this soon, will try and arrange for residential hospice  if he survives another 24 hours.   Note wrong contact information in the chart.  Correct contact information is as below.  Correct contact phone numbers for patient's son  Robert- [name], attempted to call but no answer. Number is [phone] and [phone] -message left on 2019/08/02 at 11:26 AM.  Phone number in the chart is incorrect. Correct phone number is [phone] for [name], or [phone] for [name]. Family updated on patient status. Family informed that the can visit patient for 15 minutes. Family to discuss situation and plan.  Updated patient's sister-in-law [name] on 2019/08/02 at 11:25 AM.  Also discussed with patient's brother on 08/02/2019 at 11:35 AM.       Other medical problems addressed this admission are below.    1. Acute Hypoxic Resp. Failure due to Acute Covid 19 Viral Pneumonitis during the ongoing 2020 Covid 19 Pandemic - he has severe disease, currently on 12 L high flow oxygen, in moderate to severe respiratory distress, extremely elevated CRP and D-dimer despite steroids Eliquis.  At this time I will increase his IV steroids, continue remdesivir, after informed consent he already received Actemra on 07/10/2019.  Continue to monitor clinically, overall condition is tenuous if he declines further he will be an extremely poor candidate for mechanical ventilation or intubation, two-physician DNR has been made, if he declines any further we will focus on full comfort. 3 attempts of contacting brother on 3 consecutive days have failed, detailed messages have been left.  Encouraged the patient to sit up in chair in the daytime use I-S and flutter valve for pulmonary toiletry and then prone in bed when at night.    SpO2: 98 % O2 Flow Rate (L/min): 4 L/min FiO2 (%): 95 %  Recent Labs  Lab 07/11/19 0531 07/12/19 0358 07/13/19 0647 07/14/19 1310 07/14/19 1340  CRP 13.6* 7.6* 3.1* 1.0*  --   DDIMER >20.00* >20.00* >20.00* 15.24*  --   FERRITIN 822*  --   --   --    --   BNP 88.7 94.9 77.1  --  75.6  PROCALCITON 0.19 0.10 <0.10 <0.10  --     Hepatic Function Latest Ref Rng & Units 07/14/2019 07/13/2019 07/12/2019  Total Protein 6.5 - 8.1 g/dL 5.6(L) 6.8 7.2  Albumin 3.5 - 5.0 g/dL 2.5(L) 3.0(L) 2.8(L)  AST 15 - 41 U/L 61(H) 69(H) 54(H)  ALT 0 - 44 U/L 83(H) 76(H) 68(H)  Alk Phosphatase 38 - 126 U/L 69 87 101  Total Bilirubin 0.3 - 1.2 mg/dL 1.8(H) 1.7(H) 1.5(H)     2.  New onset A. fib RVR [place] vas 2 score of at least 2.  Rate in good control, will place him on oral Cardizem and beta-blocker and titrate off his Cardizem drip.  Full anticoagulation.     Lab Results  Component Value Date   TSH 0.298 (L) 07/12/2019     3.  Acute bilateral lower extremity DVT.  Is on full dose Lovenox but now seems to have developed a left-sided psoas hematoma, hold anticoagulation, monitor H&H and place IVC filter.  4.  Left psoas hematoma.  Hold anticoagulation, IVC filter for DVT.    5. Hypertension.  Currently on Cardizem and beta-blocker will monitor and adjust as needed.  6.  Hypothyroidism.  Will place on methimazole, outpatient endocrine follow-up.  7.  Urinary retention.  Placed on Foley Flomax on 07/13/2019.  8.  Toxic and metabolic encephalopathy.  Haldol as needed, avoid benzodiazepines and narcotics, restraints as needed to prevent self injury.  9.  Aspiration pneumonia diagnosed clinically 07/14/2019 due to rapidly rising CBC, sudden respiratory decline and decline in his mental status.  Failed treatment with Unasyn.  Condition - Extremely Guarded  Family Communication  :     Correct contact phone numbers for patient's son Robert- [name], attempted to call but no answer. Number is [phone] and [phone] -message left on 2019/08/10 at 11:26 AM.  Phone number in the chart is incorrect. Correct phone number is [phone] for [name], or [phone] for [place]. Family updated on patient status. Family informed that the can visit patient for 15  minutes. Family to discuss situation and plan.  Updated patient's sister-in-law [name] on 08-10-19 at 11:25 AM.  Code Status : Two-physician DNR made.  Diet :    Diet Order            Diet full liquid Room service appropriate? Yes; Fluid consistency: Thin  Diet effective now               Disposition Plan  : On full comfort care morphine drip, requested residential hospice via case management on 07/16/2019.  Consults  : Cardiology  Procedures  :    CT angiogram.  No PE  Venous ultrasound legs.  PUD Prophylaxis :  None  DVT Prophylaxis  :  Lovenox    Lab Results  Component Value Date   PLT 206 07/14/2019    Inpatient Medications  Scheduled Meds: . chlorhexidine  15 mL Mouth Rinse BID  . Chlorhexidine Gluconate Cloth  6 each Topical Daily  . doxycycline  100 mg Oral Q12H  . feeding supplement (ENSURE ENLIVE)  237 mL Oral BID BM  . mouth rinse  15 mL Mouth Rinse BID  . methimazole  5 mg Oral TID  . tamsulosin  0.4 mg Oral Daily   Continuous Infusions: . sodium chloride 10 mL/hr at Aug 10, 2019 0651  . morphine 2 mg/hr (Aug 10, 2019 0651)   PRN Meds:.sodium chloride, acetaminophen, acetaminophen, guaiFENesin, guaiFENesin-dextromethorphan, haloperidol lactate, lip balm, LORazepam, metoprolol tartrate, sodium chloride, white petrolatum  Antibiotics  :    Anti-infectives (From admission, onward)   Start     Dose/Rate Route Frequency Ordered Stop   07/14/19 1930  Ampicillin-Sulbactam (UNASYN) 3 g in sodium chloride 0.9 % 100 mL IVPB  Status:  Discontinued     3 g 200 mL/hr over 30 Minutes Intravenous Every 6 hours 07/14/19 1919 07/15/19 1023   07/11/19 1345  doxycycline (VIBRA-TABS) tablet 100 mg     100 mg Oral Every 12 hours 07/11/19 1344     07/11/19 1000  remdesivir 100 mg in sodium chloride 0.9 % 100 mL IVPB     100 mg 200 mL/hr over 30 Minutes Intravenous Daily 07/10/19 0223 07/14/19 1126   07/10/19 0300  remdesivir 200 mg in sodium chloride 0.9% 250 mL IVPB     200  mg 580 mL/hr over 30 Minutes Intravenous Once 07/10/19 0223 07/10/19 0551   07/25/2019 2345  cefTRIAXone (ROCEPHIN) 2 g in sodium chloride 0.9 % 100 mL IVPB  Status:  Discontinued     2 g 200 mL/hr over 30 Minutes Intravenous Every 24 hours 07/19/2019 2337 07/10/19 0222   07/26/2019 2345  azithromycin (ZITHROMAX) 500 mg in sodium chloride 0.9 % 250 mL IVPB  Status:  Discontinued     500 mg 250 mL/hr over 60 Minutes Intravenous Every 24 hours 07/25/2019 2337 07/10/19 0222       Time Spent in minutes  30   Lala Lund M.D on August 10, 2019 at 11:26 AM  To page go to [email or web address] - password [facility]  [facility] -  Office  [phone]    See all  Orders from today for further details    Objective:   Vitals:   07-18-19 0625 07-18-2019 0635 07/18/2019 0643 07-18-2019 0651  BP:   97/60 100/62  Pulse: (!) 120 (!) 120 (!) 119 (!) 103  Resp:      Temp:   (!) 100.9 F (38.3 C)   TempSrc:   Axillary   SpO2: (!) 88% 96% 95% 98%  Weight:      Height:        Wt Readings from Last 3 Encounters:  07-18-2019 90 kg  07/31/18 90.7 kg  10/17/12 102.1 kg     Intake/Output Summary (Last 24 hours) at 07-18-2019 1126 Last data filed at July 18, 2019 0900 Gross per 24 hour  Intake 152.24 ml  Output 600 ml  Net -447.76 ml     Physical Exam  General in bed, unable to answer questions or follow commands, appears to to be less short of breath and resting comfortably, Supple Neck,   Symmetrical Chest wall movement, Good air movement bilaterally  RRR,No Gallops, Rubs or new Murmurs, No Parasternal Heave +ve B.Sounds, Abd Soft, No tenderness, No organomegaly appriciated, No rebound - guarding or rigidity. No Cyanosis     Data Review:    CBC Recent Labs  Lab 07/11/19 0531 07/12/19 0358 07/13/19 0647 07/14/19 1310 07/14/19 1821  WBC 10.4 11.4* 15.8* 19.1* 26.9*  HGB 13.0 14.1 15.1 12.7* 11.9*  HCT 38.4* 41.4 44.5 36.6* 35.5*  PLT 134* 128* 147* 203 206  MCV 85.9 85.9 85.6 84.9 86.8  MCH 29.1  29.3 29.0 29.5 29.1  MCHC 33.9 34.1 33.9 34.7 33.5  RDW 13.7 13.7 13.6 13.3 13.6  LYMPHSABS 0.6* 0.6* 1.0 1.4  --   MONOABS 0.6 0.4 1.0 1.6*  --   EOSABS 0.0 0.0 0.0 0.0  --   BASOSABS 0.0 0.0 0.0 0.1  --     Chemistries  Recent Labs  Lab 07/11/19 0531 07/12/19 0358 07/13/19 0647 07/14/19 1310  NA 142 140 137 136  K 3.4* 3.9 4.5 4.0  CL 108 102 102 103  CO2 24 22 22  19*  GLUCOSE 108* 142* 122* 284*  BUN 35* 40* 35* 36*  CREATININE 1.06 0.91 0.99 1.45*  CALCIUM 7.7* 8.2* 8.1* 7.7*  MG 2.7* 2.4 2.3 2.2  AST 52* 54* 69* 61*  ALT 61* 68* 76* 83*  ALKPHOS 84 101 87 69  BILITOT 1.0 1.5* 1.7* 1.8*   ------------------------------------------------------------------------------------------------------------------ No results for input(s): CHOL, HDL, LDLCALC, TRIG, CHOLHDL, LDLDIRECT in the last 72 hours.  Lab Results  Component Value Date   HGBA1C 5.7 (H) 02/27/2012   ------------------------------------------------------------------------------------------------------------------ No results for input(s): TSH, T4TOTAL, T3FREE, THYROIDAB in the last 72 hours.  Invalid input(s): FREET3  Cardiac Enzymes No results for input(s): CKMB, TROPONINI, MYOGLOBIN in the last 168 hours.  Invalid input(s): CK ------------------------------------------------------------------------------------------------------------------    Component Value Date/Time   BNP 75.6 07/14/2019 1340    Micro Results Recent Results (from the past 240 hour(s))  Culture, blood (routine x 2)     Status: None   Collection Time: 08/05/2019 10:14 PM   Specimen: BLOOD  Result Value Ref Range Status   Specimen Description BLOOD RIGHT ANTECUBITAL  Final   Special Requests   Final    BOTTLES DRAWN AEROBIC AND ANAEROBIC Blood Culture results may not be optimal due to an inadequate volume of blood received in culture bottles   Culture   Final    NO GROWTH 5 DAYS Performed at [facility], [address] [place], [place] [address]    Report Status 07/14/2019 FINAL  Final  Culture, blood (routine x 2)     Status: None   Collection Time: 07/10/19 12:12 AM   Specimen: BLOOD RIGHT FOREARM  Result Value Ref Range Status   Specimen Description BLOOD RIGHT FOREARM  Final   Special Requests   Final    BOTTLES DRAWN AEROBIC AND ANAEROBIC Blood Culture adequate volume   Culture   Final    NO GROWTH 5 DAYS Performed at [facility], [place] [address] [place], [place] [address]    Report Status 07/15/2019 FINAL  Final  Respiratory Panel by RT PCR (Flu A&B, Covid) - Nasopharyngeal Swab     Status: Abnormal   Collection Time: 07/10/19 12:12 AM   Specimen: Nasopharyngeal Swab  Result Value Ref Range Status   SARS Coronavirus 2 by RT PCR POSITIVE (A) NEGATIVE Final    Comment: RESULT CALLED TO, READ BACK BY AND VERIFIED WITH: VENEGAS L, RN AT 0211 ON 07/10/2019 BY SAINVILUS S (NOTE) SARS-CoV-2 target nucleic acids are DETECTED. SARS-CoV-2 RNA is generally detectable in upper respiratory specimens  during the acute phase of infection. Positive results are indicative of the presence of the identified virus, but do not rule out bacterial infection or co-infection with other pathogens not detected by the test. Clinical correlation with patient history and other diagnostic information is necessary to determine patient infection status. The expected result is Negative. Fact Sheet for Patients:  PinkCheek.be Fact Sheet for Healthcare Providers: GravelBags.it This test is not yet approved or cleared by the [place] FDA and  has been authorized for detection and/or diagnosis of SARS-CoV-2 by FDA under an Emergency Use Authorization (EUA).  This EUA will remain in effect (meaning this test  can be used) for the duration of  the COVID-19 declaration under Section 564(b)(1) of the Act, 21 U.S.C. section 360bbb-3(b)(1), unless the  authorization is terminated or revoked sooner.    Influenza A by PCR NEGATIVE NEGATIVE Final   Influenza B by PCR NEGATIVE NEGATIVE Final    Comment: (NOTE) The Xpert Xpress SARS-CoV-2/FLU/RSV assay is intended as an aid in  the diagnosis of influenza from Nasopharyngeal swab specimens and  should not be used as a sole basis for treatment. Nasal washings and  aspirates are unacceptable for Xpert Xpress SARS-CoV-2/FLU/RSV  testing. Fact Sheet for Patients: PinkCheek.be Fact Sheet for Healthcare Providers: GravelBags.it This test is not yet approved or cleared by the [place] FDA and  has been authorized for detection and/or diagnosis of SARS-CoV-2 by  FDA under an Emergency Use Authorization (EUA). This EUA will remain  in effect (meaning this test can be used) for the duration of the  Covid-19 declaration under Section 564(b)(1) of the Act, 21  U.S.C. section 360bbb-3(b)(1), unless the authorization is  terminated or revoked. Performed at [facility], [place] [address] [place], [place] [address]   Urine culture     Status: None   Collection Time: 07/10/19  4:18 AM   Specimen: In/Out Cath Urine  Result Value Ref Range Status   Specimen Description IN/OUT CATH URINE  Final   Special Requests NONE  Final   Culture   Final    NO GROWTH Performed at [facility], [place] [address], [place], [place] [address]    Report Status 07/10/2019 FINAL  Final  MRSA PCR Screening     Status: None   Collection Time: 07/10/19  5:27  AM   Specimen: Nasal Mucosa; Nasopharyngeal  Result Value Ref Range Status   MRSA by PCR NEGATIVE NEGATIVE Final    Comment:        The GeneXpert MRSA Assay (FDA approved for NASAL specimens only), is one component of a comprehensive MRSA colonization surveillance program. It is not intended to diagnose MRSA infection nor to guide or monitor treatment for MRSA infections. Performed at  [facility], [place] [address] [place], [place] [address]     Radiology Reports CT ABDOMEN PELVIS WO CONTRAST  Result Date: 07/14/2019 CLINICAL DATA:  79 year old EXAM: CT ABDOMEN AND PELVIS WITHOUT CONTRAST TECHNIQUE: Multidetector CT imaging of the abdomen and pelvis was performed following the standard protocol without IV contrast. COMPARISON:  11/15/2010. FINDINGS: Lower chest: Peripheral ground-glass airspace opacities throughout the visualized lung bases. BILATERAL lower lobe bronchiectasis. No pleural effusions. Normal heart size. Three-vessel coronary atherosclerosis. Hepatobiliary: Normal unenhanced appearance of the liver. Gallbladder normal in appearance without calcified gallstones. No biliary ductal dilation. Pancreas: Normal unenhanced appearance. Spleen: Normal unenhanced appearance. Small focus of accessory splenic tissue anterior to the spleen at the hilum. Adrenals/Urinary Tract: Normal appearing adrenal glands. Non-obstructing approximate 12 x 15 mm calculus in a mid calyx of the LEFT kidney. Non-obstructing calculi in adjacent LOWER pole calices of the RIGHT kidney, the largest measuring approximately 12 x 13 mm. No hydronephrosis. Parapelvic renal cysts as noted on the prior CT. Within the limits of the unenhanced technique, no significant focal parenchymal abnormality involving either kidney. Urinary bladder decompressed by Foley catheter, accounting for the gas in the bladder. Stomach/Bowel: Stomach normal in appearance for the degree of distention. Normal-appearing small bowel. Mobile cecum positioned in the RIGHT UPPER QUADRANT of the abdomen. Descending and sigmoid colon diverticulosis without evidence of acute diverticulitis. Remainder of the colon unremarkable. Surgically absent appendix. Vascular/Lymphatic: Moderate aortoiliofemoral atherosclerosis without evidence of aneurysm. No pathologic lymphadenopathy. Reproductive: Moderate to marked enlargement of the prostate gland.  Normal seminal vesicles. Other: Large LEFT psoas hematoma extending into the LEFT ileus psoas muscle, measuring maximally approximately 12.3 x 9.6 x 18.8 cm. No evidence of intraperitoneal hemorrhage. Musculoskeletal: Mixed lucency and sclerosis involving the ischia bilaterally and the entire sacrum. Numerous sclerotic lesions in the iliac bones. Schmorl's node in the LOWER endplate of L4 anteriorly. Sclerotic lesion involving the posteroinferior L1 vertebral body. IMPRESSION: 1. Large LEFT psoas hematoma extending into the LEFT ileus psoas muscle, presumably a spontaneous in the absence of recent endovascular procedures. No evidence of intraperitoneal hemorrhage. 2. Peripheral ground-glass airspace opacities throughout the visualized lung bases bilaterally which are imaging features of COVID-19 pneumonia. Other processes such as influenza pneumonia and organizing pneumonia, as can be seen with drug toxicity and connective tissue disease, can cause a similar imaging pattern. 3. Osseous metastatic disease involving the sacrum, iliac bones and the posteroinferior L1 vertebral body. As there is moderate to marked prostate gland enlargement, metastatic prostate cancer is a consideration. Please correlate with PSA. 4. Descending and sigmoid colon diverticulosis without evidence of acute diverticulitis. 5. Non-obstructing bilateral renal calculi. Aortic Atherosclerosis (ICD10-I70.0). These results will be called to the ordering clinician or representative by the Radiologist Assistant, and communication documented in the PACS or zVision Dashboard. Electronically Signed   By: Evangeline Dakin M.D.   On: 07/14/2019 10:41   CT ANGIO CHEST PE W OR WO CONTRAST  Result Date: 07/10/2019 CLINICAL DATA:  Dyspnea on exertion. Increasing shortness of breath. COVID-19. EXAM: CT ANGIOGRAPHY CHEST WITH CONTRAST TECHNIQUE: Multidetector CT imaging  of the chest was performed using the standard protocol during bolus administration of  intravenous contrast. Multiplanar CT image reconstructions and MIPs were obtained to evaluate the vascular anatomy. CONTRAST:  27m OMNIPAQUE IOHEXOL 350 MG/ML SOLN COMPARISON:  Chest x-ray dated 07/13/2019 FINDINGS: Cardiovascular: No pulmonary emboli. Heart size is normal. No pericardial effusion. Aortic atherosclerosis. Mediastinum/Nodes: No enlarged mediastinal, hilar, or axillary lymph nodes. Thyroid gland, trachea, and esophagus demonstrate no significant findings. Lungs/Pleura: Extensive bilateral primarily peripheral pulmonary infiltrates most severe in the right upper lobe and in both lower lobes. No effusions. Upper Abdomen: No acute abnormality. 13 mm stone in the lower mid left kidney, unchanged since the prior CT scan of the abdomen dated 11/15/2010 Musculoskeletal: No acute abnormality. Osteophytes fuse multiple levels in the lower thoracic spine. Review of the MIP images confirms the above findings. IMPRESSION: 1. No pulmonary emboli. 2. Extensive bilateral primarily peripheral pulmonary infiltrates consistent with pneumonia. Electronically Signed   By: JLorriane ShireM.D.   On: 07/10/2019 11:23   IR IVC FILTER PLMT / S&I /Burke KeelsGUID/MOD SED  Result Date: 07/15/2019 INDICATION: 79year old male with a history of hemorrhage on anticoagulation. EXAM: IMAGE GUIDED PLACEMENT OF RETRIEVABLE IVC FILTER ULTRASOUND GUIDED ACCESS RIGHT COMMON FEMORAL VEIN MEDICATIONS: None. ANESTHESIA/SEDATION: None FLUOROSCOPY TIME:  Fluoroscopy Time: 0 minutes 24 seconds (39 mGy). COMPLICATIONS: 9 none PROCEDURE: The procedure, risks, benefits, and alternatives were explained to the patient. Specific risks discussed include bleeding, infection, contrast reaction, renal failure, IVC filter fracture, migration, iliocaval thrombus (3-4% incidence), need for further procedure, need for further surgery, pulmonary embolism, cardiopulmonary collapse, death. Questions regarding the procedure were encouraged and answered. The  patient understands and consents to the procedure. Ultrasound survey was performed with images stored and sent to PACs. The right inguinal region was prepped with chlorhexidine in a sterile fashion, and a sterile drape was applied covering the operative field. A sterile gown and sterile gloves were used for the procedure. Local anesthesia was provided with 1% Lidocaine. A single wall needle was used access the right common femoral vein under ultrasound. With excellent venous blood flow returned, 035 wire was passed through the needle. Small incision was made with an 11 blade scalpel. The needle was removed, and dilation was performed over the wire. The delivery sheath for a retrievable Bard Denali filter was passed over the Bentson wire into the IVC. The wire was removed and small contrast was used to confirm IVC location. IVC cavagram performed. Dilator was removed, and the IVC filter was then delivered, positioned below the lowest renal vein. Repeat cavagram performed, and the catheter was removed. Manual pressure was used for hemostasis. Patient tolerated the procedure well and remained hemodynamically stable throughout. No complications were encountered and no significant blood loss was encounter. IMPRESSION: Status post ultrasound guided access right common femoral vein 4 deployment of retrievable IVC filter. Signed, JDulcy Fanny WDellia Nims RPVI [facility] Rehabilitation[facility] PLAN: This IVC filter is potentially retrievable. The patient will be assessed for filter retrieval by Interventional Radiology in approximately 8-12 weeks. Further recommendations regarding filter retrieval, continued surveillance or declaration of device permanence, will be made at that time. Electronically Signed   By: JCorrie MckusickD.O.   On: 07/15/2019 08:28   DG Chest Port 1 View  Result Date: 2March 07, 2021CLINICAL DATA:  79year old with shortness of breath. EXAM: PORTABLE CHEST 1 VIEW  COMPARISON:  08/02/2019 and on 07/14/2019 FINDINGS: Patchy interstitial and airspace densities in the right lung have not changed.  No significant change in the interstitial and airspace densities in the left lower lung. Heart size is within normal limits and stable. Trachea is midline. Negative for a large pneumothorax. However, there appears to be a small amount of subcutaneous gas in the right upper chest and right lower neck region. IMPRESSION: Bilateral parenchymal lung disease, right side greater than left. Minimal change from the recent comparison examination. Concern for a small amount of subcutaneous gas. No definite pneumothorax. Electronically Signed   By: Markus Daft M.D.   On: 07/23/19 09:14   DG Chest Port 1 View  Result Date: 07/14/2019 CLINICAL DATA:  Shortness of breath EXAM: PORTABLE CHEST 1 VIEW COMPARISON:  07/14/2019, 5:53 a.m. FINDINGS: The heart size and mediastinal contours are within normal limits. There is extensive bilateral, predominantly peripheral and basilar heterogeneous airspace disease, similar to prior examination. No new airspace opacity. The visualized skeletal structures are unremarkable. IMPRESSION: No significant interval change in extensive bilateral heterogeneous airspace disease, consistent with multifocal infection. No new airspace opacity. Electronically Signed   By: Eddie Candle M.D.   On: 07/14/2019 20:26   DG Chest Port 1 View  Result Date: 07/14/2019 CLINICAL DATA:  Shortness of breath. COVID positive. EXAM: PORTABLE CHEST 1 VIEW COMPARISON:  07/18/2019 FINDINGS: The cardiomediastinal silhouette is unremarkable. Bilateral airspace opacities are unchanged. No pneumothorax or definite pleural effusion. Little interval change from prior study. IMPRESSION: Unchanged chest radiograph with bilateral airspace opacities. Electronically Signed   By: Margarette Canada M.D.   On: 07/14/2019 10:18   DG Chest Port 1 View  Result Date: 07/09/2019 CLINICAL DATA:  Shortness of  breath. EXAM: PORTABLE CHEST 1 VIEW COMPARISON:  July 31, 2018 FINDINGS: Moderate to marked severity infiltrates are seen within the bilateral lung bases and along the periphery of the mid to upper right lung. There is no evidence of a pleural effusion or pneumothorax. The heart size and mediastinal contours are within normal limits. Multilevel degenerative changes seen throughout the thoracic spine. IMPRESSION: 1. Moderate to marked severity bilateral infiltrates, right greater than left. Electronically Signed   By: Virgina Norfolk M.D.   On: 07/26/2019 23:10   VAS [place] LOWER EXTREMITY VENOUS (DVT)  Result Date: 07/12/2019  Lower Venous DVTStudy Indications: Swelling, and Covid+, Elevated D dimer.  Anticoagulation: Lovenox. Comparison Study: No prior exam. Performing Technologist: Baldwin Crown RDMS, RVT  Examination Guidelines: A complete evaluation includes B-mode imaging, spectral Doppler, color Doppler, and power Doppler as needed of all accessible portions of each vessel. Bilateral testing is considered an integral part of a complete examination. Limited examinations for reoccurring indications may be performed as noted. The reflux portion of the exam is performed with the patient in reverse Trendelenburg.  +---------+---------------+---------+-----------+----------+--------------+ RIGHT    CompressibilityPhasicitySpontaneityPropertiesThrombus Aging +---------+---------------+---------+-----------+----------+--------------+ CFV      Full           Yes      Yes                                 +---------+---------------+---------+-----------+----------+--------------+ SFJ      Full                                                        +---------+---------------+---------+-----------+----------+--------------+ FV Prox  Full                                                        +---------+---------------+---------+-----------+----------+--------------+  FV Mid   Full                                                         +---------+---------------+---------+-----------+----------+--------------+ FV DistalFull                                                        +---------+---------------+---------+-----------+----------+--------------+ PFV      Full                                                        +---------+---------------+---------+-----------+----------+--------------+ POP      None           No       No                   Acute          +---------+---------------+---------+-----------+----------+--------------+ PTV      None                                                        +---------+---------------+---------+-----------+----------+--------------+ PERO     None                                                        +---------+---------------+---------+-----------+----------+--------------+ Gastroc  None           No       No                   Acute          +---------+---------------+---------+-----------+----------+--------------+   +---------+---------------+---------+-----------+----------+--------------+ LEFT     CompressibilityPhasicitySpontaneityPropertiesThrombus Aging +---------+---------------+---------+-----------+----------+--------------+ CFV      Full           Yes      Yes                                 +---------+---------------+---------+-----------+----------+--------------+ SFJ      Full                                                        +---------+---------------+---------+-----------+----------+--------------+ FV Prox  Full                                                        +---------+---------------+---------+-----------+----------+--------------+  FV Mid   Full                                                        +---------+---------------+---------+-----------+----------+--------------+ FV DistalFull                                                         +---------+---------------+---------+-----------+----------+--------------+ PFV      Full                                                        +---------+---------------+---------+-----------+----------+--------------+ POP      Full           Yes      Yes                                 +---------+---------------+---------+-----------+----------+--------------+ PTV      None                                                        +---------+---------------+---------+-----------+----------+--------------+ PERO     None                                                        +---------+---------------+---------+-----------+----------+--------------+ Gastroc  None                                         Acute          +---------+---------------+---------+-----------+----------+--------------+     Summary: RIGHT: - Findings consistent with acute deep vein thrombosis involving the right popliteal vein, right posterior tibial veins, right peroneal veins, and right gastrocnemius veins. - No cystic structure found in the popliteal fossa.  LEFT: - Findings consistent with acute deep vein thrombosis involving the left posterior tibial veins, left peroneal veins, and one left gastrocnemius vein. - No cystic structure found in the popliteal fossa.  *See table(s) above for measurements and observations. Electronically signed by Servando Snare MD on 07/12/2019 at 2:47:28 PM.    Final

## 2019-08-06 NOTE — TOC Initial Note (Addendum)
Transition of Care [facility]) - Initial/Assessment Note    Patient Details  Name: [name] MRN: [id number] Date of Birth: 06/18/1940  [facility] CM/SW Contact:    Robert Parr, RN Phone Number: 07/13/2019, 2:36 PM  Clinical Narrative:    CM was able to reach pts son [name] [phone] . CM explained situation with father and that he has been deemed appropriate for residential hospice.  Son acknowledges that his father is at end of life.  Son informed CM that he lives in [place] and he deferred hospice house decision to his uncle [name].  CM spoke with [name] - offered choice and [facility] hospice house was chosen.  Brother informed that agency would be in contact to arrange for required paperwork to be signed.  Brother in agreement for pt to transfer to hospice house today if possible.   CM gave referral to [name] with [facility]. - referral under review      Update 1600:  Pt has been accepted into [facility] house - not yet assigned bed.  Agency will send required documents to pts brother [name] via [employer].  Agency will follow up with CM once papers are signed, room number known and report number given.  Pt will require completed DNR to travel via [facility] - attending made aware  Update: 1650:  Transport pkg printed to unit and placed on shadow chart.  Paper work required for hospice has been completed.   Update 1734:  Per note - nurse has already called report to facility.  CM unable to reach nurse.  CM requested secretary relay to nurse to call [facility] once pt is ready - facility can receive pt now.  .     Expected Discharge Plan: Hospice Medical Facility Barriers to Discharge: Continued Medical Work up   Patient Goals and CMS Choice     Choice offered to / list presented to : Adult Children, Sibling  Expected Discharge Plan and Services Expected Discharge Plan: Hospice Medical Facility                                     [facility] Agency: [facility] Date [facility] Agency Contacted: 07/12/2019 Time HH Agency Contacted: 1435 Representative spoke with at [facility] Agency: [name]  Prior Living Arrangements/Services                       Activities of Daily Living Home Assistive Devices/Equipment: None ADL Screening (condition at time of admission) Patient's cognitive ability adequate to safely complete daily activities?: No Is the patient deaf or have difficulty hearing?: No Does the patient have difficulty seeing, even when wearing glasses/contacts?: No Does the patient have difficulty concentrating, remembering, or making decisions?: Yes Patient able to express need for assistance with ADLs?: Yes Does the patient have difficulty dressing or bathing?: Yes Independently performs ADLs?: No Communication: Independent Dressing (OT): Needs assistance Is this a change from baseline?: Change from baseline, expected to last <3days Grooming: Independent Feeding: Independent Bathing: Needs assistance Is this a change from baseline?: Change from baseline, expected to last <3 days Toileting: Needs assistance Is this a change from baseline?: Change from baseline, expected to last <3 days In/Out Bed: Needs assistance Is this a change from baseline?: Change from baseline, expected to last <3 days Walks in Home: Needs assistance Is this a change from baseline?: Change from baseline,  expected to last <3 days Does the patient have difficulty walking or climbing stairs?: Yes Weakness of Legs: Both Weakness of Arms/Hands: None  Permission Sought/Granted   Permission granted to share information with : Yes, Verbal Permission Granted     Permission granted to share info w AGENCY: [facility]        Emotional Assessment       Orientation: : Fluctuating Orientation (Suspected and/or reported Sundowners)      Admission diagnosis:  SOB (shortness of breath) [R06.02] Acute respiratory failure with hypoxia ([place])  [J96.01] Patient Active Problem List   Diagnosis Date Noted  . Pneumonia due to COVID-19 virus   . Goals of care, counseling/discussion   . Palliative care by specialist   . Multifocal pneumonia 07/10/2019  . Suspected COVID-19 virus infection 07/10/2019  . Sepsis ([place]) 07/10/2019  . Acute respiratory failure with hypoxia ([place]) 07/10/2019  . Atrial fibrillation with RVR ([place]) 07/10/2019  . ARDS (adult respiratory distress syndrome) ([place]) 07/10/2019  . Allergic rhinitis 10/17/2012  . Dyslipidemia   . Neuropathy   . Anxiety   . Chest pain 07/14/2011  . Hypertension 07/14/2011   PCP:  Raeanne Gathers, MD Pharmacy:   [id number] - [place], [place]. [address] [place] [address] Phone: [phone] Fax: [phone]  [facility] [place], [place] [address] [place] [address] Phone: [phone] Fax: [phone]  [facility] [place] [place] - [address] [place] [address] Phone: [phone] Fax: [phone]     Social Determinants of Health (SDOH) Interventions    Readmission Risk Interventions No flowsheet data found.

## 2019-08-06 NOTE — Progress Notes (Signed)
Report called to [facility]. IV and foley to remain in place.

## 2019-08-06 NOTE — Progress Notes (Signed)
Patient's respirations seem slightly more labored, still tachypnea. M. Katherina Right, NP notified. This RN has increased O2 and repositioned patient. Morphine still continuous at 27ml/h, unchanged. Patient has gotten Ativan. Will continue to monitor.

## 2019-08-06 NOTE — Discharge Instructions (Addendum)
Disposition.  Residential hospice °Condition.  Guarded °CODE STATUS.  DNR °Activity.  With assistance as tolerated, full fall precautions. °Diet.  Soft with feeding assistance and aspiration precautions. °Goal of care.  Comfort. ° °

## 2019-08-06 NOTE — Death Summary Note (Signed)
[facility] Death Note                                                                                                                                                                                               [name], is a 79 y.o. male, DOB - Jul 05, 1940, [id number]:[id number]  Admit date - 07/23/2019   Admitting Physician Etta Quill, DO  Outpatient Primary MD for the patient is Raeanne Gathers, MD  LOS - 7  Chief Complaint  Patient presents with  . Shortness of Breath       Notification: Raeanne Gathers, MD notified of death of 08-06-19   Date and Time of Death - 06-Aug-2019 at 6pm  Pronounced by - RN  History of present illness:   [name] is a 79 y.o. male with a history of -  Anxiety, HLD.  Pt presented to ED with SOB.   He was apparently sick for 3 to 4 days before seeking medical attention, upon arrival to the ER his work-up suggested acute hypoxic respiratory failure due to COVID-19 pneumonia, he was also found to have new onset A. fib and RVR.  His hospital stay was complicated by DVT, psoas abscess, aspiration pneumonia, he also developed severe toxic and metabolic encephalopathy, after sustained decline he was transition to full comfort measures on 07/14/2019 night.   Final Diagnoses:  Cause if death - Covid 49 infection  Signature  Lala Lund M.D on 06-Aug-2019 at 6:06 PM  [facility]  Office Phone [phone]  Total clinical and documentation time for today Under 30 minutes   Last Note                                                                        PROGRESS NOTE  Patient Demographics:    [name], is a 79 y.o. male, DOB - [birth year],  [id number]  Outpatient Primary MD for the patient is Raeanne Gathers, MD    LOS - 7  Admit date - 07/19/2019    Chief Complaint  Patient presents with  . Shortness of Breath       Brief Narrative  [name] is a 79 y.o. male with medical history significant of Anxiety, HLD.  Pt presented to ED with SOB.   He was apparently sick for 3 to 4 days before seeking medical attention, upon arrival to the ER his work-up suggested acute hypoxic respiratory failure due to COVID-19 pneumonia, he was also found to have new onset A. fib and RVR.  His hospital stay was complicated by DVT, psoas abscess, aspiration pneumonia, he also developed severe toxic and metabolic encephalopathy, after sustained decline he was transition to full comfort measures on 07/14/2019 night.    Subjective:   Patient in bed, appears to be resting comfortably on morphine drip   Assessment  & Plan :     At this time patient has been transition to full comfort measures.  He had extensive Covid pneumonia complicated by possibly aspiration pneumonia evening of 07/14/2019, he also developed a large DVT which was again complicated by psoas bleed, finally he developed severe encephalopathy in the setting of ongoing aspiration and continued to decline despite full appropriate medical treatment.  Due to his advanced age, multiple comorbidities and serial decline in medical condition he was transition to full comfort measures night of 07/14/2019.  Goal of care now will be comfort.  He remains DNR.  Expect him to pass away this soon, will try and arrange for residential hospice if he survives another 24 hours.   Note wrong contact information in the chart.  Correct contact information is as below.  Correct contact phone numbers for patient's son CJ- KISEAN ROLLO, attempted to call but no answer. Number is [phone] and [phone] -message left on 07-19-2019 at 11:26 AM.  Phone number in the chart is incorrect. Correct phone number is  [phone] for [name], or [phone] for [name]. Family updated on patient status. Family informed that the can visit patient for 15 minutes. Family to discuss situation and plan.  Updated patient's sister-in-law [name] on 19-Jul-2019 at 11:25 AM.  Also discussed with patient's brother on July 19, 2019 at 11:35 AM.       Other medical problems addressed this admission are below.    1. Acute Hypoxic Resp. Failure due to Acute Covid 19 Viral Pneumonitis during the ongoing 2020 Covid 19 Pandemic - he has severe disease, currently on 12 L high flow oxygen, in moderate to severe respiratory distress, extremely elevated CRP and D-dimer despite steroids Eliquis.  At this time I will increase his IV steroids, continue remdesivir, after informed consent he already received Actemra on 07/10/2019.  Continue to monitor clinically, overall condition is tenuous if he declines further he will be an extremely poor candidate for mechanical ventilation or intubation, two-physician DNR has been made, if he declines any further we will focus on full comfort. 3 attempts of contacting brother on 3 consecutive days have failed, detailed messages have been left.  Encouraged the patient to sit up in chair in the daytime use I-S and flutter valve for pulmonary toiletry and then prone in bed when at night.    SpO2: 98 % O2 Flow Rate (L/min): 4 L/min FiO2 (%): 95 %  Recent Labs  Lab 07/11/19  5732 07/12/19 0358 07/13/19 0647 07/14/19 1310 07/14/19 1340  CRP 13.6* 7.6* 3.1* 1.0*  --   DDIMER >20.00* >20.00* >20.00* 15.24*  --   FERRITIN 822*  --   --   --   --   BNP 88.7 94.9 77.1  --  75.6  PROCALCITON 0.19 0.10 <0.10 <0.10  --     Hepatic Function Latest Ref Rng & Units 07/14/2019 07/13/2019 07/12/2019  Total Protein 6.5 - 8.1 g/dL 5.6(L) 6.8 7.2  Albumin 3.5 - 5.0 g/dL 2.5(L) 3.0(L) 2.8(L)  AST 15 - 41 U/L 61(H) 69(H) 54(H)  ALT 0 - 44 U/L 83(H) 76(H) 68(H)  Alk Phosphatase 38 - 126 U/L 69 87 101  Total Bilirubin  0.3 - 1.2 mg/dL 1.8(H) 1.7(H) 1.5(H)     2.  New onset A. fib RVR [place] vas 2 score of at least 2.  Rate in good control, will place him on oral Cardizem and beta-blocker and titrate off his Cardizem drip.  Full anticoagulation.     Lab Results  Component Value Date   TSH 0.298 (L) 07/12/2019     3.  Acute bilateral lower extremity DVT.  Is on full dose Lovenox but now seems to have developed a left-sided psoas hematoma, hold anticoagulation, monitor H&H and place IVC filter.  4.  Left psoas hematoma.  Hold anticoagulation, IVC filter for DVT.    5. Hypertension.  Currently on Cardizem and beta-blocker will monitor and adjust as needed.  6.  Hypothyroidism.  Will place on methimazole, outpatient endocrine follow-up.  7.  Urinary retention.  Placed on Foley Flomax on 07/13/2019.  8.  Toxic and metabolic encephalopathy.  Haldol as needed, avoid benzodiazepines and narcotics, restraints as needed to prevent self injury.  9.  Aspiration pneumonia diagnosed clinically 07/14/2019 due to rapidly rising CBC, sudden respiratory decline and decline in his mental status.  Failed treatment with Unasyn.     Condition - Extremely Guarded  Family Communication  :     Correct contact phone numbers for patient's son CJ- JERRAD MENDIBLES, attempted to call but no answer. Number is [phone] and [phone] -message left on 08/12/2019 at 11:26 AM.  Phone number in the chart is incorrect. Correct phone number is [phone] for [name], or [phone] for [place]. Family updated on patient status. Family informed that the can visit patient for 15 minutes. Family to discuss situation and plan.  Updated patient's sister-in-law [name] on 2019/08/12 at 11:25 AM.  Code Status : Two-physician DNR made.  Diet :    Diet Order            Diet full liquid Room service appropriate? Yes; Fluid consistency: Thin  Diet effective now               Disposition Plan  : On full comfort care morphine drip, requested  residential hospice via case management on 07/16/2019.  Consults  : Cardiology  Procedures  :    CT angiogram.  No PE  Venous ultrasound legs.  PUD Prophylaxis :  None  DVT Prophylaxis  :  Lovenox    Lab Results  Component Value Date   PLT 206 07/14/2019    Inpatient Medications  Scheduled Meds: . chlorhexidine  15 mL Mouth Rinse BID  . Chlorhexidine Gluconate Cloth  6 each Topical Daily  . doxycycline  100 mg Oral Q12H  . feeding supplement (ENSURE ENLIVE)  237 mL Oral BID BM  . mouth rinse  15 mL Mouth Rinse BID  .  methimazole  5 mg Oral TID  . tamsulosin  0.4 mg Oral Daily   Continuous Infusions: . sodium chloride 10 mL/hr at 08-13-2019 0651  . morphine Stopped (2019/08/13 1805)   PRN Meds:.sodium chloride, acetaminophen, acetaminophen, guaiFENesin, guaiFENesin-dextromethorphan, haloperidol lactate, lip balm, LORazepam, metoprolol tartrate, sodium chloride, white petrolatum  Antibiotics  :    Anti-infectives (From admission, onward)   Start     Dose/Rate Route Frequency Ordered Stop   07/14/19 1930  Ampicillin-Sulbactam (UNASYN) 3 g in sodium chloride 0.9 % 100 mL IVPB  Status:  Discontinued     3 g 200 mL/hr over 30 Minutes Intravenous Every 6 hours 07/14/19 1919 07/15/19 1023   07/11/19 1345  doxycycline (VIBRA-TABS) tablet 100 mg     100 mg Oral Every 12 hours 07/11/19 1344     07/11/19 1000  remdesivir 100 mg in sodium chloride 0.9 % 100 mL IVPB     100 mg 200 mL/hr over 30 Minutes Intravenous Daily 07/10/19 0223 07/14/19 1126   07/10/19 0300  remdesivir 200 mg in sodium chloride 0.9% 250 mL IVPB     200 mg 580 mL/hr over 30 Minutes Intravenous Once 07/10/19 0223 07/10/19 0551   07/24/2019 2345  cefTRIAXone (ROCEPHIN) 2 g in sodium chloride 0.9 % 100 mL IVPB  Status:  Discontinued     2 g 200 mL/hr over 30 Minutes Intravenous Every 24 hours 07/18/2019 2337 07/10/19 0222   08/05/2019 2345  azithromycin (ZITHROMAX) 500 mg in sodium chloride 0.9 % 250 mL IVPB  Status:   Discontinued     500 mg 250 mL/hr over 60 Minutes Intravenous Every 24 hours 07/19/2019 2337 07/10/19 0222       Time Spent in minutes  30   Lala Lund M.D on 13-Aug-2019 at 6:06 PM  To page go to [email or web address] - password [facility]  [facility] -  Office  [phone]    See all Orders from today for further details    Objective:   Vitals:   08/13/19 0625 2019/08/13 0635 Aug 13, 2019 0643 08-13-19 0651  BP:   97/60 100/62  Pulse: (!) 120 (!) 120 (!) 119 (!) 103  Resp:      Temp:   (!) 100.9 F (38.3 C)   TempSrc:   Axillary   SpO2: (!) 88% 96% 95% 98%  Weight:      Height:        Wt Readings from Last 3 Encounters:  13-Aug-2019 90 kg  07/31/18 90.7 kg  10/17/12 102.1 kg     Intake/Output Summary (Last 24 hours) at 08-13-19 1806 Last data filed at Aug 13, 2019 1700 Gross per 24 hour  Intake 171.06 ml  Output 225 ml  Net -53.94 ml     Physical Exam  General in bed, unable to answer questions or follow commands, appears to to be less short of breath and resting comfortably, Supple Neck,   Symmetrical Chest wall movement, Good air movement bilaterally  RRR,No Gallops, Rubs or new Murmurs, No Parasternal Heave +ve B.Sounds, Abd Soft, No tenderness, No organomegaly appriciated, No rebound - guarding or rigidity. No Cyanosis     Data Review:    CBC Recent Labs  Lab 07/11/19 0531 07/12/19 0358 07/13/19 0647 07/14/19 1310 07/14/19 1821  WBC 10.4 11.4* 15.8* 19.1* 26.9*  HGB 13.0 14.1 15.1 12.7* 11.9*  HCT 38.4* 41.4 44.5 36.6* 35.5*  PLT 134* 128* 147* 203 206  MCV 85.9 85.9 85.6 84.9 86.8  MCH 29.1 29.3 29.0 29.5 29.1  MCHC 33.9  34.1 33.9 34.7 33.5  RDW 13.7 13.7 13.6 13.3 13.6  LYMPHSABS 0.6* 0.6* 1.0 1.4  --   MONOABS 0.6 0.4 1.0 1.6*  --   EOSABS 0.0 0.0 0.0 0.0  --   BASOSABS 0.0 0.0 0.0 0.1  --     Chemistries  Recent Labs  Lab 07/11/19 0531 07/12/19 0358 07/13/19 0647 07/14/19 1310  NA 142 140 137 136  K 3.4* 3.9 4.5 4.0  CL 108 102 102  103  CO2 _0 19*  GLUCOSE 108* 142* 122* 284*  BUN 35* 40* 35* 36*  CREATININE 1.06 0.91 0.99 1.45*  CALCIUM 7.7* 8.2* 8.1* 7.7*  MG 2.7* 2.4 2.3 2.2  AST 52* 54* 69* 61*  ALT 61* 68* 76* 83*  ALKPHOS 84 101 87 69  BILITOT 1.0 1.5* 1.7* 1.8*   ------------------------------------------------------------------------------------------------------------------ No results for input(s): CHOL, HDL, LDLCALC, TRIG, CHOLHDL, LDLDIRECT in the last 72 hours.  Lab Results  Component Value Date   HGBA1C 5.7 (H) 02/27/2012   ------------------------------------------------------------------------------------------------------------------ No results for input(s): TSH, T4TOTAL, T3FREE, THYROIDAB in the last 72 hours.  Invalid input(s): FREET3  Cardiac Enzymes No results for input(s): CKMB, TROPONINI, MYOGLOBIN in the last 168 hours.  Invalid input(s): CK ------------------------------------------------------------------------------------------------------------------    Component Value Date/Time   BNP 75.6 07/14/2019 1340    Micro Results Recent Results (from the past 240 hour(s))  Culture, blood (routine x 2)     Status: None   Collection Time: 07/27/2019 10:14 PM   Specimen: BLOOD  Result Value Ref Range Status   Specimen Description BLOOD RIGHT ANTECUBITAL  Final   Special Requests   Final    BOTTLES DRAWN AEROBIC AND ANAEROBIC Blood Culture results may not be optimal due to an inadequate volume of blood received in culture bottles   Culture   Final    NO GROWTH 5 DAYS Performed at [facility], [place] [address] [place], [place] [address]    Report Status 07/14/2019 FINAL  Final  Culture, blood (routine x 2)     Status: None   Collection Time: 07/10/19 12:12 AM   Specimen: BLOOD RIGHT FOREARM  Result Value Ref Range Status   Specimen Description BLOOD RIGHT FOREARM  Final   Special Requests   Final    BOTTLES DRAWN AEROBIC AND ANAEROBIC Blood Culture adequate volume    Culture   Final    NO GROWTH 5 DAYS Performed at [facility], [place] [address] [place], [place] [address]    Report Status 07/15/2019 FINAL  Final  Respiratory Panel by RT PCR (Flu A&B, Covid) - Nasopharyngeal Swab     Status: Abnormal   Collection Time: 07/10/19 12:12 AM   Specimen: Nasopharyngeal Swab  Result Value Ref Range Status   SARS Coronavirus 2 by RT PCR POSITIVE (A) NEGATIVE Final    Comment: RESULT CALLED TO, READ BACK BY AND VERIFIED WITH: VENEGAS L, RN AT 0211 ON 07/10/2019 BY SAINVILUS S (NOTE) SARS-CoV-2 target nucleic acids are DETECTED. SARS-CoV-2 RNA is generally detectable in upper respiratory specimens  during the acute phase of infection. Positive results are indicative of the presence of the identified virus, but do not rule out bacterial infection or co-infection with other pathogens not detected by the test. Clinical correlation with patient history and other diagnostic information is necessary to determine patient infection status. The expected result is Negative. Fact Sheet for Patients:  PinkCheek.be Fact Sheet for Healthcare Providers: GravelBags.it This test is not yet approved  or cleared by the [place] and  has been authorized for detection and/or diagnosis of SARS-CoV-2 by FDA under an Emergency Use Authorization (EUA).  This EUA will remain in effect (meaning this test  can be used) for the duration of  the COVID-19 declaration under Section 564(b)(1) of the Act, 21 U.S.C. section 360bbb-3(b)(1), unless the authorization is terminated or revoked sooner.    Influenza A by PCR NEGATIVE NEGATIVE Final   Influenza B by PCR NEGATIVE NEGATIVE Final    Comment: (NOTE) The Xpert Xpress SARS-CoV-2/FLU/RSV assay is intended as an aid in  the diagnosis of influenza from Nasopharyngeal swab specimens and  should not be used as a sole basis for treatment. Nasal washings and  aspirates  are unacceptable for Xpert Xpress SARS-CoV-2/FLU/RSV  testing. Fact Sheet for Patients: PinkCheek.be Fact Sheet for Healthcare Providers: GravelBags.it This test is not yet approved or cleared by the [place] FDA and  has been authorized for detection and/or diagnosis of SARS-CoV-2 by  FDA under an Emergency Use Authorization (EUA). This EUA will remain  in effect (meaning this test can be used) for the duration of the  Covid-19 declaration under Section 564(b)(1) of the Act, 21  U.S.C. section 360bbb-3(b)(1), unless the authorization is  terminated or revoked. Performed at [facility], [place] [address] [place], [place] [address]   Urine culture     Status: None   Collection Time: 07/10/19  4:18 AM   Specimen: In/Out Cath Urine  Result Value Ref Range Status   Specimen Description IN/OUT CATH URINE  Final   Special Requests NONE  Final   Culture   Final    NO GROWTH Performed at [facility], [place] [address] [place], [place] [address]    Report Status 07/10/2019 FINAL  Final  MRSA PCR Screening     Status: None   Collection Time: 07/10/19  5:27 AM   Specimen: Nasal Mucosa; Nasopharyngeal  Result Value Ref Range Status   MRSA by PCR NEGATIVE NEGATIVE Final    Comment:        The GeneXpert MRSA Assay (FDA approved for NASAL specimens only), is one component of a comprehensive MRSA colonization surveillance program. It is not intended to diagnose MRSA infection nor to guide or monitor treatment for MRSA infections. Performed at [facility], [place] [address], [place], [place] [address]     Radiology Reports CT ABDOMEN PELVIS WO CONTRAST  Result Date: 07/14/2019 CLINICAL DATA:  79 year old EXAM: CT ABDOMEN AND PELVIS WITHOUT CONTRAST TECHNIQUE: Multidetector CT imaging of the abdomen and pelvis was performed following the standard protocol without IV contrast. COMPARISON:  11/15/2010. FINDINGS:  Lower chest: Peripheral ground-glass airspace opacities throughout the visualized lung bases. BILATERAL lower lobe bronchiectasis. No pleural effusions. Normal heart size. Three-vessel coronary atherosclerosis. Hepatobiliary: Normal unenhanced appearance of the liver. Gallbladder normal in appearance without calcified gallstones. No biliary ductal dilation. Pancreas: Normal unenhanced appearance. Spleen: Normal unenhanced appearance. Small focus of accessory splenic tissue anterior to the spleen at the hilum. Adrenals/Urinary Tract: Normal appearing adrenal glands. Non-obstructing approximate 12 x 15 mm calculus in a mid calyx of the LEFT kidney. Non-obstructing calculi in adjacent LOWER pole calices of the RIGHT kidney, the largest measuring approximately 12 x 13 mm. No hydronephrosis. Parapelvic renal cysts as noted on the prior CT. Within the limits of the unenhanced technique, no significant focal parenchymal abnormality involving either kidney. Urinary bladder decompressed by Foley catheter, accounting for the gas in  the bladder. Stomach/Bowel: Stomach normal in appearance for the degree of distention. Normal-appearing small bowel. Mobile cecum positioned in the RIGHT UPPER QUADRANT of the abdomen. Descending and sigmoid colon diverticulosis without evidence of acute diverticulitis. Remainder of the colon unremarkable. Surgically absent appendix. Vascular/Lymphatic: Moderate aortoiliofemoral atherosclerosis without evidence of aneurysm. No pathologic lymphadenopathy. Reproductive: Moderate to marked enlargement of the prostate gland. Normal seminal vesicles. Other: Large LEFT psoas hematoma extending into the LEFT ileus psoas muscle, measuring maximally approximately 12.3 x 9.6 x 18.8 cm. No evidence of intraperitoneal hemorrhage. Musculoskeletal: Mixed lucency and sclerosis involving the ischia bilaterally and the entire sacrum. Numerous sclerotic lesions in the iliac bones. Schmorl's node in the LOWER  endplate of L4 anteriorly. Sclerotic lesion involving the posteroinferior L1 vertebral body. IMPRESSION: 1. Large LEFT psoas hematoma extending into the LEFT ileus psoas muscle, presumably a spontaneous in the absence of recent endovascular procedures. No evidence of intraperitoneal hemorrhage. 2. Peripheral ground-glass airspace opacities throughout the visualized lung bases bilaterally which are imaging features of COVID-19 pneumonia. Other processes such as influenza pneumonia and organizing pneumonia, as can be seen with drug toxicity and connective tissue disease, can cause a similar imaging pattern. 3. Osseous metastatic disease involving the sacrum, iliac bones and the posteroinferior L1 vertebral body. As there is moderate to marked prostate gland enlargement, metastatic prostate cancer is a consideration. Please correlate with PSA. 4. Descending and sigmoid colon diverticulosis without evidence of acute diverticulitis. 5. Non-obstructing bilateral renal calculi. Aortic Atherosclerosis (ICD10-I70.0). These results will be called to the ordering clinician or representative by the Radiologist Assistant, and communication documented in the PACS or zVision Dashboard. Electronically Signed   By: Evangeline Dakin M.D.   On: 07/14/2019 10:41   CT ANGIO CHEST PE W OR WO CONTRAST  Result Date: 07/10/2019 CLINICAL DATA:  Dyspnea on exertion. Increasing shortness of breath. COVID-19. EXAM: CT ANGIOGRAPHY CHEST WITH CONTRAST TECHNIQUE: Multidetector CT imaging of the chest was performed using the standard protocol during bolus administration of intravenous contrast. Multiplanar CT image reconstructions and MIPs were obtained to evaluate the vascular anatomy. CONTRAST:  55m OMNIPAQUE IOHEXOL 350 MG/ML SOLN COMPARISON:  Chest x-ray dated 07/10/2019 FINDINGS: Cardiovascular: No pulmonary emboli. Heart size is normal. No pericardial effusion. Aortic atherosclerosis. Mediastinum/Nodes: No enlarged mediastinal, hilar, or  axillary lymph nodes. Thyroid gland, trachea, and esophagus demonstrate no significant findings. Lungs/Pleura: Extensive bilateral primarily peripheral pulmonary infiltrates most severe in the right upper lobe and in both lower lobes. No effusions. Upper Abdomen: No acute abnormality. 13 mm stone in the lower mid left kidney, unchanged since the prior CT scan of the abdomen dated 11/15/2010 Musculoskeletal: No acute abnormality. Osteophytes fuse multiple levels in the lower thoracic spine. Review of the MIP images confirms the above findings. IMPRESSION: 1. No pulmonary emboli. 2. Extensive bilateral primarily peripheral pulmonary infiltrates consistent with pneumonia. Electronically Signed   By: JLorriane ShireM.D.   On: 07/10/2019 11:23   IR IVC FILTER PLMT / S&I /Burke KeelsGUID/MOD SED  Result Date: 07/15/2019 INDICATION: 80year old male with a history of hemorrhage on anticoagulation. EXAM: IMAGE GUIDED PLACEMENT OF RETRIEVABLE IVC FILTER ULTRASOUND GUIDED ACCESS RIGHT COMMON FEMORAL VEIN MEDICATIONS: None. ANESTHESIA/SEDATION: None FLUOROSCOPY TIME:  Fluoroscopy Time: 0 minutes 24 seconds (39 mGy). COMPLICATIONS: 9 none PROCEDURE: The procedure, risks, benefits, and alternatives were explained to the patient. Specific risks discussed include bleeding, infection, contrast reaction, renal failure, IVC filter fracture, migration, iliocaval thrombus (3-4% incidence), need for further procedure, need for further surgery, pulmonary embolism, cardiopulmonary  collapse, death. Questions regarding the procedure were encouraged and answered. The patient understands and consents to the procedure. Ultrasound survey was performed with images stored and sent to PACs. The right inguinal region was prepped with chlorhexidine in a sterile fashion, and a sterile drape was applied covering the operative field. A sterile gown and sterile gloves were used for the procedure. Local anesthesia was provided with 1% Lidocaine. A single  wall needle was used access the right common femoral vein under ultrasound. With excellent venous blood flow returned, 035 wire was passed through the needle. Small incision was made with an 11 blade scalpel. The needle was removed, and dilation was performed over the wire. The delivery sheath for a retrievable Bard Denali filter was passed over the Bentson wire into the IVC. The wire was removed and small contrast was used to confirm IVC location. IVC cavagram performed. Dilator was removed, and the IVC filter was then delivered, positioned below the lowest renal vein. Repeat cavagram performed, and the catheter was removed. Manual pressure was used for hemostasis. Patient tolerated the procedure well and remained hemodynamically stable throughout. No complications were encountered and no significant blood loss was encounter. IMPRESSION: Status post ultrasound guided access right common femoral vein 4 deployment of retrievable IVC filter. Signed, Dulcy Fanny. Dellia Nims, RPVI [facility] Radiology PLAN: This IVC filter is potentially retrievable. The patient will be assessed for filter retrieval by Interventional Radiology in approximately 8-12 weeks. Further recommendations regarding filter retrieval, continued surveillance or declaration of device permanence, will be made at that time. Electronically Signed   By: Corrie Mckusick D.O.   On: 07/15/2019 08:28   DG Chest Port 1 View  Result Date: 2019/08/10 CLINICAL DATA:  79 year old with shortness of breath. EXAM: PORTABLE CHEST 1 VIEW COMPARISON:  07/26/2019 and on 07/14/2019 FINDINGS: Patchy interstitial and airspace densities in the right lung have not changed. No significant change in the interstitial and airspace densities in the left lower lung. Heart size is within normal limits and stable. Trachea is midline. Negative for a large pneumothorax. However, there appears to be a small amount of subcutaneous gas in  the right upper chest and right lower neck region. IMPRESSION: Bilateral parenchymal lung disease, right side greater than left. Minimal change from the recent comparison examination. Concern for a small amount of subcutaneous gas. No definite pneumothorax. Electronically Signed   By: Markus Daft M.D.   On: 08/10/19 09:14   DG Chest Port 1 View  Result Date: 07/14/2019 CLINICAL DATA:  Shortness of breath EXAM: PORTABLE CHEST 1 VIEW COMPARISON:  07/14/2019, 5:53 a.m. FINDINGS: The heart size and mediastinal contours are within normal limits. There is extensive bilateral, predominantly peripheral and basilar heterogeneous airspace disease, similar to prior examination. No new airspace opacity. The visualized skeletal structures are unremarkable. IMPRESSION: No significant interval change in extensive bilateral heterogeneous airspace disease, consistent with multifocal infection. No new airspace opacity. Electronically Signed   By: Eddie Candle M.D.   On: 07/14/2019 20:26   DG Chest Port 1 View  Result Date: 07/14/2019 CLINICAL DATA:  Shortness of breath. COVID positive. EXAM: PORTABLE CHEST 1 VIEW COMPARISON:  07/19/2019 FINDINGS: The cardiomediastinal silhouette is unremarkable. Bilateral airspace opacities are unchanged. No pneumothorax or definite pleural effusion. Little interval change from prior study. IMPRESSION: Unchanged chest radiograph with bilateral airspace opacities. Electronically Signed   By: Margarette Canada M.D.   On: 07/14/2019 10:18   DG Chest Port 1 View  Result Date:  07/12/2019 CLINICAL DATA:  Shortness of breath. EXAM: PORTABLE CHEST 1 VIEW COMPARISON:  July 31, 2018 FINDINGS: Moderate to marked severity infiltrates are seen within the bilateral lung bases and along the periphery of the mid to upper right lung. There is no evidence of a pleural effusion or pneumothorax. The heart size and mediastinal contours are within normal limits. Multilevel degenerative changes seen throughout the  thoracic spine. IMPRESSION: 1. Moderate to marked severity bilateral infiltrates, right greater than left. Electronically Signed   By: Virgina Norfolk M.D.   On: 08/05/2019 23:10   VAS [place] LOWER EXTREMITY VENOUS (DVT)  Result Date: 07/12/2019  Lower Venous DVTStudy Indications: Swelling, and Covid+, Elevated D dimer.  Anticoagulation: Lovenox. Comparison Study: No prior exam. Performing Technologist: Baldwin Crown RDMS, RVT  Examination Guidelines: A complete evaluation includes B-mode imaging, spectral Doppler, color Doppler, and power Doppler as needed of all accessible portions of each vessel. Bilateral testing is considered an integral part of a complete examination. Limited examinations for reoccurring indications may be performed as noted. The reflux portion of the exam is performed with the patient in reverse Trendelenburg.  +---------+---------------+---------+-----------+----------+--------------+ RIGHT    CompressibilityPhasicitySpontaneityPropertiesThrombus Aging +---------+---------------+---------+-----------+----------+--------------+ CFV      Full           Yes      Yes                                 +---------+---------------+---------+-----------+----------+--------------+ SFJ      Full                                                        +---------+---------------+---------+-----------+----------+--------------+ FV Prox  Full                                                        +---------+---------------+---------+-----------+----------+--------------+ FV Mid   Full                                                        +---------+---------------+---------+-----------+----------+--------------+ FV DistalFull                                                        +---------+---------------+---------+-----------+----------+--------------+ PFV      Full                                                         +---------+---------------+---------+-----------+----------+--------------+ POP      None           No       No  Acute          +---------+---------------+---------+-----------+----------+--------------+ PTV      None                                                        +---------+---------------+---------+-----------+----------+--------------+ PERO     None                                                        +---------+---------------+---------+-----------+----------+--------------+ Gastroc  None           No       No                   Acute          +---------+---------------+---------+-----------+----------+--------------+   +---------+---------------+---------+-----------+----------+--------------+ LEFT     CompressibilityPhasicitySpontaneityPropertiesThrombus Aging +---------+---------------+---------+-----------+----------+--------------+ CFV      Full           Yes      Yes                                 +---------+---------------+---------+-----------+----------+--------------+ SFJ      Full                                                        +---------+---------------+---------+-----------+----------+--------------+ FV Prox  Full                                                        +---------+---------------+---------+-----------+----------+--------------+ FV Mid   Full                                                        +---------+---------------+---------+-----------+----------+--------------+ FV DistalFull                                                        +---------+---------------+---------+-----------+----------+--------------+ PFV      Full                                                        +---------+---------------+---------+-----------+----------+--------------+ POP      Full           Yes      Yes                                  +---------+---------------+---------+-----------+----------+--------------+  PTV      None                                                        +---------+---------------+---------+-----------+----------+--------------+ PERO     None                                                        +---------+---------------+---------+-----------+----------+--------------+ Gastroc  None                                         Acute          +---------+---------------+---------+-----------+----------+--------------+     Summary: RIGHT: - Findings consistent with acute deep vein thrombosis involving the right popliteal vein, right posterior tibial veins, right peroneal veins, and right gastrocnemius veins. - No cystic structure found in the popliteal fossa.  LEFT: - Findings consistent with acute deep vein thrombosis involving the left posterior tibial veins, left peroneal veins, and one left gastrocnemius vein. - No cystic structure found in the popliteal fossa.  *See table(s) above for measurements and observations. Electronically signed by Servando Snare MD on 07/12/2019 at 2:47:28 PM.    Final

## 2019-08-06 NOTE — Progress Notes (Signed)
Son called in to facetime patient. This RN assisted so family could speak to and see patient. Patient did have a coughing reaction and opened eyes temporarily during the son speaking. Respirations increased, so Ativan given IV per PRN orders. Respirations did decrease slightly. SpO2 decreased to 88% on O2 2L [place], so O2 increased to 3L [place] with increase in SpO2 to 96%. Will continue to monitor.

## 2019-08-06 NOTE — Progress Notes (Signed)
Patient passed at 1800 this evening. MD notified. Family notified. [facility] notified.

## 2019-08-06 NOTE — Progress Notes (Signed)
[facility]: [employer] with the pt's brother due to his son not be easily accessible (in [place]). [name] Pt's brother is in agreement and will sign paperwork by email. The pt was approved by our MD Dr. Karle Barr. He will eligible to move or transfer today once the consents are returned.   Please have RN call report to [facility] [phone]  Please leave in any lines or IV access so that our facility staff can continue to utilize them. He will be unattached from the morphine gtt and once he arrives to our facility we will hook him up to ours.  Thank you Norm Parcel RN [phone] [facility]

## 2019-08-06 NOTE — Progress Notes (Signed)
Updated patient's son CJ, brother and aunt on the phone. Brother [name] requests that he will be notified when patient passes to pick up all belongings.

## 2019-08-06 NOTE — Discharge Summary (Signed)
[name] DIY:[id number] DOB: 11-27-1940 DOA: 07/25/2019  PCP: Raeanne Gathers, MD  Admit date: 08/01/2019  Discharge date: 2019-07-28  Admitted From: Home   Disposition:  Residential Hospice   Recommendations for Outpatient Follow-up:   Follow up with PCP in 1-2 weeks  PCP Please obtain BMP/CBC, 2 view CXR in 1week,  (see Discharge instructions)   PCP Please follow up on the following pending results:    Home Health: None Equipment/Devices: None  Consultations: None  Discharge Condition: Guarded   CODE STATUS: DNR   Diet Recommendation: For comfort only, soft diet with feeding assistance and aspiration precautions.  Diet Order            Diet full liquid Room service appropriate? Yes; Fluid consistency: Thin  Diet effective now               Chief Complaint  Patient presents with  . Shortness of Breath     Brief history of present illness from the day of admission and additional interim summary    Robert Morella Proctoris a 79 y.o.malewith medical history significant ofAnxiety, HLD.  Pt presented to ED with SOB.  He was apparently sick for 3 to 4 days before seeking medical attention, upon arrival to the ER his work-up suggested acute hypoxic respiratory failure due to COVID-19 pneumonia, he was also found to have new onset A. fib and RVR.  His hospital stay was complicated by DVT, psoas abscess, aspiration pneumonia, he also developed severe toxic and metabolic encephalopathy, after sustained decline he was transition to full comfort measures on 07/14/2019 night.                                                                 Hospital Course    At this time patient has been transition to full comfort measures.  He had extensive Covid pneumonia complicated by possibly aspiration pneumonia evening of  07/14/2019, he also developed a large DVT which was again complicated by psoas bleed, finally he developed severe encephalopathy in the setting of ongoing aspiration and continued to decline despite full appropriate medical treatment.  Due to his advanced age, multiple comorbidities and serial decline in medical condition he was transition to full comfort measures night of 07/14/2019.  Goal of care now will be comfort.  He remains DNR.  Expect him to pass away this soon, will try and arrange for residential hospice if he survives another 24 hours.   Note wrong contact information in the chart.  Correct contact information is as below.  Correct contact phone numbers for patient's son [name], attempted to call but no answer. Number is [phone] and [phone] -message left on 07/28/19 at 11:26 AM.  Phone number in the chart is incorrect. Correct phone number is [phone] for [name], or  [phone] for [name]. Family updated on patient status. Family informed that the can visit patient for 15 minutes. Family to discuss situation and plan.  Updated patient's sister-in-law [name] on 2019-08-08 at 11:25 AM.  Also discussed with patient's brother on 2019/08/08 at 11:35 AM.      Other medical problems addressed this admission are below.  Now all medications stopped and on full comfort measures.    1. Acute Hypoxic Resp. Failure due to Acute Covid 19 Viral Pneumonitis during the ongoing 2020 Covid 19 Pandemic - he has severe disease, currently on 12 L high flow oxygen, in moderate to severe respiratory distress, extremely elevated CRP and D-dimer despite steroids Eliquis.  Despite appropriate treatment patient showed sustained but gradual decline, he was transitioned to full comfort care and made DNR, family aware, will be discharged to residential hospice, all noncomfort medications have been stopped.   SpO2: 98 % O2 Flow Rate (L/min): 4 L/min FiO2 (%): 95 %  Recent Labs  Lab  07/11/19 0531 07/12/19 0358 07/13/19 0647 07/14/19 1310 07/14/19 1340  CRP 13.6* 7.6* 3.1* 1.0*  --   DDIMER >20.00* >20.00* >20.00* 15.24*  --   FERRITIN 822*  --   --   --   --   BNP 88.7 94.9 77.1  --  75.6  PROCALCITON 0.19 0.10 <0.10 <0.10  --     Hepatic Function Latest Ref Rng & Units 07/14/2019 07/13/2019 07/12/2019  Total Protein 6.5 - 8.1 g/dL 5.6(L) 6.8 7.2  Albumin 3.5 - 5.0 g/dL 2.5(L) 3.0(L) 2.8(L)  AST 15 - 41 U/L 61(H) 69(H) 54(H)  ALT 0 - 44 U/L 83(H) 76(H) 68(H)  Alk Phosphatase 38 - 126 U/L 69 87 101  Total Bilirubin 0.3 - 1.2 mg/dL 1.8(H) 1.7(H) 1.5(H)    3.  Acute bilateral lower extremity DVT.  Is on full dose Lovenox but now seems to have developed a left-sided psoas hematoma, hold anticoagulation, monitor H&H and place IVC filter.  Now on comfort medications only.  4.  Left psoas hematoma.  Hold anticoagulation, IVC filter for DVT.    5. Hypertension.  Was on beta-blocker and calcium channel blocker now stopped.  6.  Hypothyroidism.    Placed on methimazole now on comfort meds only.  7.  Urinary retention.  Placed on Foley Flomax on 07/13/2019.  8.  Toxic and metabolic encephalopathy.  Supportive care.  9.  Aspiration pneumonia diagnosed clinically 07/14/2019 due to rapidly rising CBC, sudden respiratory decline and decline in his mental status.  Failed treatment with Unasyn.    Discharge diagnosis     Principal Problem:   Multifocal pneumonia Active Problems:   Hypertension   Suspected COVID-19 virus infection   Sepsis ([place])   Acute respiratory failure with hypoxia (HCC)   Atrial fibrillation with RVR (HCC)   ARDS (adult respiratory distress syndrome) (HCC)   Pneumonia due to COVID-19 virus   Goals of care, counseling/discussion   Palliative care by specialist    Discharge instructions    Discharge Instructions    Discharge instructions   Complete by: As directed    Disposition.  Residential hospice Condition.  Guarded CODE STATUS.   DNR Activity.  With assistance as tolerated, full fall precautions. Diet.  Soft with feeding assistance and aspiration precautions. Goal of care.  Comfort.      Discharge Medications   Allergies as of 08-08-2019      Reactions   Ativan [lorazepam]    Resp. arrest   Shellfish Allergy Anaphylaxis  Medication List    STOP taking these medications   aspirin 81 MG chewable tablet   cloNIDine 0.1 MG tablet Commonly known as: CATAPRES   ipratropium 0.06 % nasal spray Commonly known as: Atrovent   montelukast 10 MG tablet Commonly known as: Singulair   nitroGLYCERIN 0.4 MG SL tablet Commonly known as: NITROSTAT     TAKE these medications   LORazepam 2 MG/ML concentrated solution Commonly known as: ATIVAN Take 0.5 mLs (1 mg total) by mouth every 6 (six) hours as needed for anxiety.   morphine CONCENTRATE 10 MG/0.5ML Soln concentrated solution Take 0.5 mLs (10 mg total) by mouth every 3 (three) hours as needed for moderate pain or severe pain.         Major procedures and Radiology Reports - PLEASE review detailed and final reports thoroughly  -         CT ABDOMEN PELVIS WO CONTRAST  Result Date: 07/14/2019 CLINICAL DATA:  79 year old EXAM: CT ABDOMEN AND PELVIS WITHOUT CONTRAST TECHNIQUE: Multidetector CT imaging of the abdomen and pelvis was performed following the standard protocol without IV contrast. COMPARISON:  11/15/2010. FINDINGS: Lower chest: Peripheral ground-glass airspace opacities throughout the visualized lung bases. BILATERAL lower lobe bronchiectasis. No pleural effusions. Normal heart size. Three-vessel coronary atherosclerosis. Hepatobiliary: Normal unenhanced appearance of the liver. Gallbladder normal in appearance without calcified gallstones. No biliary ductal dilation. Pancreas: Normal unenhanced appearance. Spleen: Normal unenhanced appearance. Small focus of accessory splenic tissue anterior to the spleen at the hilum. Adrenals/Urinary Tract:  Normal appearing adrenal glands. Non-obstructing approximate 12 x 15 mm calculus in a mid calyx of the LEFT kidney. Non-obstructing calculi in adjacent LOWER pole calices of the RIGHT kidney, the largest measuring approximately 12 x 13 mm. No hydronephrosis. Parapelvic renal cysts as noted on the prior CT. Within the limits of the unenhanced technique, no significant focal parenchymal abnormality involving either kidney. Urinary bladder decompressed by Foley catheter, accounting for the gas in the bladder. Stomach/Bowel: Stomach normal in appearance for the degree of distention. Normal-appearing small bowel. Mobile cecum positioned in the RIGHT UPPER QUADRANT of the abdomen. Descending and sigmoid colon diverticulosis without evidence of acute diverticulitis. Remainder of the colon unremarkable. Surgically absent appendix. Vascular/Lymphatic: Moderate aortoiliofemoral atherosclerosis without evidence of aneurysm. No pathologic lymphadenopathy. Reproductive: Moderate to marked enlargement of the prostate gland. Normal seminal vesicles. Other: Large LEFT psoas hematoma extending into the LEFT ileus psoas muscle, measuring maximally approximately 12.3 x 9.6 x 18.8 cm. No evidence of intraperitoneal hemorrhage. Musculoskeletal: Mixed lucency and sclerosis involving the ischia bilaterally and the entire sacrum. Numerous sclerotic lesions in the iliac bones. Schmorl's node in the LOWER endplate of L4 anteriorly. Sclerotic lesion involving the posteroinferior L1 vertebral body. IMPRESSION: 1. Large LEFT psoas hematoma extending into the LEFT ileus psoas muscle, presumably a spontaneous in the absence of recent endovascular procedures. No evidence of intraperitoneal hemorrhage. 2. Peripheral ground-glass airspace opacities throughout the visualized lung bases bilaterally which are imaging features of COVID-19 pneumonia. Other processes such as influenza pneumonia and organizing pneumonia, as can be seen with drug toxicity  and connective tissue disease, can cause a similar imaging pattern. 3. Osseous metastatic disease involving the sacrum, iliac bones and the posteroinferior L1 vertebral body. As there is moderate to marked prostate gland enlargement, metastatic prostate cancer is a consideration. Please correlate with PSA. 4. Descending and sigmoid colon diverticulosis without evidence of acute diverticulitis. 5. Non-obstructing bilateral renal calculi. Aortic Atherosclerosis (ICD10-I70.0). These results will be called to the ordering clinician  or representative by the Radiologist Assistant, and communication documented in the PACS or zVision Dashboard. Electronically Signed   By: Evangeline Dakin M.D.   On: 07/14/2019 10:41   CT ANGIO CHEST PE W OR WO CONTRAST  Result Date: 07/10/2019 CLINICAL DATA:  Dyspnea on exertion. Increasing shortness of breath. COVID-19. EXAM: CT ANGIOGRAPHY CHEST WITH CONTRAST TECHNIQUE: Multidetector CT imaging of the chest was performed using the standard protocol during bolus administration of intravenous contrast. Multiplanar CT image reconstructions and MIPs were obtained to evaluate the vascular anatomy. CONTRAST:  66m OMNIPAQUE IOHEXOL 350 MG/ML SOLN COMPARISON:  Chest x-ray dated 07/13/2019 FINDINGS: Cardiovascular: No pulmonary emboli. Heart size is normal. No pericardial effusion. Aortic atherosclerosis. Mediastinum/Nodes: No enlarged mediastinal, hilar, or axillary lymph nodes. Thyroid gland, trachea, and esophagus demonstrate no significant findings. Lungs/Pleura: Extensive bilateral primarily peripheral pulmonary infiltrates most severe in the right upper lobe and in both lower lobes. No effusions. Upper Abdomen: No acute abnormality. 13 mm stone in the lower mid left kidney, unchanged since the prior CT scan of the abdomen dated 11/15/2010 Musculoskeletal: No acute abnormality. Osteophytes fuse multiple levels in the lower thoracic spine. Review of the MIP images confirms the above  findings. IMPRESSION: 1. No pulmonary emboli. 2. Extensive bilateral primarily peripheral pulmonary infiltrates consistent with pneumonia. Electronically Signed   By: JLorriane ShireM.D.   On: 07/10/2019 11:23   IR IVC FILTER PLMT / S&I /Burke KeelsGUID/MOD SED  Result Date: 07/15/2019 INDICATION: 79year old male with a history of hemorrhage on anticoagulation. EXAM: IMAGE GUIDED PLACEMENT OF RETRIEVABLE IVC FILTER ULTRASOUND GUIDED ACCESS RIGHT COMMON FEMORAL VEIN MEDICATIONS: None. ANESTHESIA/SEDATION: None FLUOROSCOPY TIME:  Fluoroscopy Time: 0 minutes 24 seconds (39 mGy). COMPLICATIONS: 9 none PROCEDURE: The procedure, risks, benefits, and alternatives were explained to the patient. Specific risks discussed include bleeding, infection, contrast reaction, renal failure, IVC filter fracture, migration, iliocaval thrombus (3-4% incidence), need for further procedure, need for further surgery, pulmonary embolism, cardiopulmonary collapse, death. Questions regarding the procedure were encouraged and answered. The patient understands and consents to the procedure. Ultrasound survey was performed with images stored and sent to PACs. The right inguinal region was prepped with chlorhexidine in a sterile fashion, and a sterile drape was applied covering the operative field. A sterile gown and sterile gloves were used for the procedure. Local anesthesia was provided with 1% Lidocaine. A single wall needle was used access the right common femoral vein under ultrasound. With excellent venous blood flow returned, 035 wire was passed through the needle. Small incision was made with an 11 blade scalpel. The needle was removed, and dilation was performed over the wire. The delivery sheath for a retrievable Bard Denali filter was passed over the Bentson wire into the IVC. The wire was removed and small contrast was used to confirm IVC location. IVC cavagram performed. Dilator was removed, and the IVC filter was then delivered,  positioned below the lowest renal vein. Repeat cavagram performed, and the catheter was removed. Manual pressure was used for hemostasis. Patient tolerated the procedure well and remained hemodynamically stable throughout. No complications were encountered and no significant blood loss was encounter. IMPRESSION: Status post ultrasound guided access right common femoral vein 4 deployment of retrievable IVC filter. Signed, JDulcy Fanny WDellia Nims RPVI [facility] GMck[facility]-Dee [facility] CenterRadiology PLAN: This IVC filter is potentially retrievable. The patient will be assessed for filter retrieval by Interventional Radiology in approximately 8-12 weeks. Further recommendations regarding filter retrieval, continued surveillance or declaration of device permanence,  will be made at that time. Electronically Signed   By: Corrie Mckusick D.O.   On: 07/15/2019 08:28   DG Chest Port 1 View  Result Date: Jul 18, 2019 CLINICAL DATA:  79 year old with shortness of breath. EXAM: PORTABLE CHEST 1 VIEW COMPARISON:  07/13/2019 and on 07/14/2019 FINDINGS: Patchy interstitial and airspace densities in the right lung have not changed. No significant change in the interstitial and airspace densities in the left lower lung. Heart size is within normal limits and stable. Trachea is midline. Negative for a large pneumothorax. However, there appears to be a small amount of subcutaneous gas in the right upper chest and right lower neck region. IMPRESSION: Bilateral parenchymal lung disease, right side greater than left. Minimal change from the recent comparison examination. Concern for a small amount of subcutaneous gas. No definite pneumothorax. Electronically Signed   By: Markus Daft M.D.   On: 07-18-2019 09:14   DG Chest Port 1 View  Result Date: 07/14/2019 CLINICAL DATA:  Shortness of breath EXAM: PORTABLE CHEST 1 VIEW COMPARISON:  07/14/2019, 5:53 a.m. FINDINGS: The heart size and mediastinal contours are within  normal limits. There is extensive bilateral, predominantly peripheral and basilar heterogeneous airspace disease, similar to prior examination. No new airspace opacity. The visualized skeletal structures are unremarkable. IMPRESSION: No significant interval change in extensive bilateral heterogeneous airspace disease, consistent with multifocal infection. No new airspace opacity. Electronically Signed   By: Eddie Candle M.D.   On: 07/14/2019 20:26   DG Chest Port 1 View  Result Date: 07/14/2019 CLINICAL DATA:  Shortness of breath. COVID positive. EXAM: PORTABLE CHEST 1 VIEW COMPARISON:  07/31/2019 FINDINGS: The cardiomediastinal silhouette is unremarkable. Bilateral airspace opacities are unchanged. No pneumothorax or definite pleural effusion. Little interval change from prior study. IMPRESSION: Unchanged chest radiograph with bilateral airspace opacities. Electronically Signed   By: Margarette Canada M.D.   On: 07/14/2019 10:18   DG Chest Port 1 View  Result Date: 07/23/2019 CLINICAL DATA:  Shortness of breath. EXAM: PORTABLE CHEST 1 VIEW COMPARISON:  July 31, 2018 FINDINGS: Moderate to marked severity infiltrates are seen within the bilateral lung bases and along the periphery of the mid to upper right lung. There is no evidence of a pleural effusion or pneumothorax. The heart size and mediastinal contours are within normal limits. Multilevel degenerative changes seen throughout the thoracic spine. IMPRESSION: 1. Moderate to marked severity bilateral infiltrates, right greater than left. Electronically Signed   By: Virgina Norfolk M.D.   On: 07/31/2019 23:10   VAS [place] LOWER EXTREMITY VENOUS (DVT)  Result Date: 07/12/2019  Lower Venous DVTStudy Indications: Swelling, and Covid+, Elevated D dimer.  Anticoagulation: Lovenox. Comparison Study: No prior exam. Performing Technologist: Baldwin Crown RDMS, RVT  Examination Guidelines: A complete evaluation includes B-mode imaging, spectral Doppler, color  Doppler, and power Doppler as needed of all accessible portions of each vessel. Bilateral testing is considered an integral part of a complete examination. Limited examinations for reoccurring indications may be performed as noted. The reflux portion of the exam is performed with the patient in reverse Trendelenburg.  +---------+---------------+---------+-----------+----------+--------------+ RIGHT    CompressibilityPhasicitySpontaneityPropertiesThrombus Aging +---------+---------------+---------+-----------+----------+--------------+ CFV      Full           Yes      Yes                                 +---------+---------------+---------+-----------+----------+--------------+ SFJ  Full                                                        +---------+---------------+---------+-----------+----------+--------------+ FV Prox  Full                                                        +---------+---------------+---------+-----------+----------+--------------+ FV Mid   Full                                                        +---------+---------------+---------+-----------+----------+--------------+ FV DistalFull                                                        +---------+---------------+---------+-----------+----------+--------------+ PFV      Full                                                        +---------+---------------+---------+-----------+----------+--------------+ POP      None           No       No                   Acute          +---------+---------------+---------+-----------+----------+--------------+ PTV      None                                                        +---------+---------------+---------+-----------+----------+--------------+ PERO     None                                                        +---------+---------------+---------+-----------+----------+--------------+ Gastroc  None           No        No                   Acute          +---------+---------------+---------+-----------+----------+--------------+   +---------+---------------+---------+-----------+----------+--------------+ LEFT     CompressibilityPhasicitySpontaneityPropertiesThrombus Aging +---------+---------------+---------+-----------+----------+--------------+ CFV      Full           Yes      Yes                                 +---------+---------------+---------+-----------+----------+--------------+ SFJ  Full                                                        +---------+---------------+---------+-----------+----------+--------------+ FV Prox  Full                                                        +---------+---------------+---------+-----------+----------+--------------+ FV Mid   Full                                                        +---------+---------------+---------+-----------+----------+--------------+ FV DistalFull                                                        +---------+---------------+---------+-----------+----------+--------------+ PFV      Full                                                        +---------+---------------+---------+-----------+----------+--------------+ POP      Full           Yes      Yes                                 +---------+---------------+---------+-----------+----------+--------------+ PTV      None                                                        +---------+---------------+---------+-----------+----------+--------------+ PERO     None                                                        +---------+---------------+---------+-----------+----------+--------------+ Gastroc  None                                         Acute          +---------+---------------+---------+-----------+----------+--------------+     Summary: RIGHT: - Findings consistent with acute deep vein thrombosis  involving the right popliteal vein, right posterior tibial veins, right peroneal veins, and right gastrocnemius veins. - No cystic structure found in the popliteal fossa.  LEFT: - Findings consistent with acute deep vein thrombosis involving the left posterior tibial veins, left peroneal veins, and one left gastrocnemius vein. -  No cystic structure found in the popliteal fossa.  *See table(s) above for measurements and observations. Electronically signed by Servando Snare MD on 07/12/2019 at 2:47:28 PM.    Final     Micro Results     Recent Results (from the past 240 hour(s))  Culture, blood (routine x 2)     Status: None   Collection Time: 07/21/2019 10:14 PM   Specimen: BLOOD  Result Value Ref Range Status   Specimen Description BLOOD RIGHT ANTECUBITAL  Final   Special Requests   Final    BOTTLES DRAWN AEROBIC AND ANAEROBIC Blood Culture results may not be optimal due to an inadequate volume of blood received in culture bottles   Culture   Final    NO GROWTH 5 DAYS Performed at [facility], [place] [address] [place], [place] [address]    Report Status 07/14/2019 FINAL  Final  Culture, blood (routine x 2)     Status: None   Collection Time: 07/10/19 12:12 AM   Specimen: BLOOD RIGHT FOREARM  Result Value Ref Range Status   Specimen Description BLOOD RIGHT FOREARM  Final   Special Requests   Final    BOTTLES DRAWN AEROBIC AND ANAEROBIC Blood Culture adequate volume   Culture   Final    NO GROWTH 5 DAYS Performed at [facility], [place] [address] [place], [place] [address]    Report Status 07/15/2019 FINAL  Final  Respiratory Panel by RT PCR (Flu A&B, Covid) - Nasopharyngeal Swab     Status: Abnormal   Collection Time: 07/10/19 12:12 AM   Specimen: Nasopharyngeal Swab  Result Value Ref Range Status   SARS Coronavirus 2 by RT PCR POSITIVE (A) NEGATIVE Final    Comment: RESULT CALLED TO, READ BACK BY AND VERIFIED WITH: VENEGAS L, RN AT 0211 ON 07/10/2019 BY SAINVILUS  S (NOTE) SARS-CoV-2 target nucleic acids are DETECTED. SARS-CoV-2 RNA is generally detectable in upper respiratory specimens  during the acute phase of infection. Positive results are indicative of the presence of the identified virus, but do not rule out bacterial infection or co-infection with other pathogens not detected by the test. Clinical correlation with patient history and other diagnostic information is necessary to determine patient infection status. The expected result is Negative. Fact Sheet for Patients:  PinkCheek.be Fact Sheet for Healthcare Providers: GravelBags.it This test is not yet approved or cleared by the [place] FDA and  has been authorized for detection and/or diagnosis of SARS-CoV-2 by FDA under an Emergency Use Authorization (EUA).  This EUA will remain in effect (meaning this test  can be used) for the duration of  the COVID-19 declaration under Section 564(b)(1) of the Act, 21 U.S.C. section 360bbb-3(b)(1), unless the authorization is terminated or revoked sooner.    Influenza A by PCR NEGATIVE NEGATIVE Final   Influenza B by PCR NEGATIVE NEGATIVE Final    Comment: (NOTE) The Xpert Xpress SARS-CoV-2/FLU/RSV assay is intended as an aid in  the diagnosis of influenza from Nasopharyngeal swab specimens and  should not be used as a sole basis for treatment. Nasal washings and  aspirates are unacceptable for Xpert Xpress SARS-CoV-2/FLU/RSV  testing. Fact Sheet for Patients: PinkCheek.be Fact Sheet for Healthcare Providers: GravelBags.it This test is not yet approved or cleared by the [place] FDA and  has been authorized for detection and/or diagnosis of SARS-CoV-2 by  FDA under an Emergency Use Authorization (EUA). This EUA will remain  in effect (meaning this test can  be used) for the duration of the  Covid-19 declaration under  Section 564(b)(1) of the Act, 21  U.S.C. section 360bbb-3(b)(1), unless the authorization is  terminated or revoked. Performed at [facility], [place] [address] [place], [place] [address]   Urine culture     Status: None   Collection Time: 07/10/19  4:18 AM   Specimen: In/Out Cath Urine  Result Value Ref Range Status   Specimen Description IN/OUT CATH URINE  Final   Special Requests NONE  Final   Culture   Final    NO GROWTH Performed at [facility], [place] [address] [place], [place] [address]    Report Status 07/10/2019 FINAL  Final  MRSA PCR Screening     Status: None   Collection Time: 07/10/19  5:27 AM   Specimen: Nasal Mucosa; Nasopharyngeal  Result Value Ref Range Status   MRSA by PCR NEGATIVE NEGATIVE Final    Comment:        The GeneXpert MRSA Assay (FDA approved for NASAL specimens only), is one component of a comprehensive MRSA colonization surveillance program. It is not intended to diagnose MRSA infection nor to guide or monitor treatment for MRSA infections. Performed at [facility], [place] [address] [place], [place] [address]     Today   Subjective    [name] in bed, in no discomfort but unresponsive on morphine drip.   Objective   Blood pressure 100/62, pulse (!) 103, temperature (!) 100.9 F (38.3 C), temperature source Axillary, resp. rate (!) 26, height 6' 1" (1.854 m), weight 90 kg, SpO2 98 %.   Intake/Output Summary (Last 24 hours) at August 03, 2019 1615 Last data filed at August 03, 2019 0900 Gross per 24 hour  Intake 152.24 ml  Output 225 ml  Net -72.76 ml    Exam  General in bed, unable to answer questions or follow commands, appears to to be less short of breath and resting comfortably, Supple Neck,   Symmetrical Chest wall movement, Good air movement bilaterally  RRR,No Gallops, Rubs or new Murmurs, No Parasternal Heave +ve B.Sounds, Abd Soft, No tenderness, No organomegaly appriciated, No rebound - guarding or  rigidity. No Cyanosis    Data Review   CBC w Diff:  Lab Results  Component Value Date   WBC 26.9 (H) 07/14/2019   HGB 11.9 (L) 07/14/2019   HCT 35.5 (L) 07/14/2019   PLT 206 07/14/2019   LYMPHOPCT 7 07/14/2019   MONOPCT 8 07/14/2019   EOSPCT 0 07/14/2019   BASOPCT 0 07/14/2019    CMP:  Lab Results  Component Value Date   NA 136 07/14/2019   K 4.0 07/14/2019   CL 103 07/14/2019   CO2 19 (L) 07/14/2019   BUN 36 (H) 07/14/2019   CREATININE 1.45 (H) 07/14/2019   PROT 5.6 (L) 07/14/2019   ALBUMIN 2.5 (L) 07/14/2019   BILITOT 1.8 (H) 07/14/2019   ALKPHOS 69 07/14/2019   AST 61 (H) 07/14/2019   ALT 83 (H) 07/14/2019  .   Total Time in preparing paper work, data evaluation and todays exam - 13 minutes  Lala Lund M.D on 08/03/2019 at [facility]   Office  [phone]

## 2019-08-06 NOTE — Progress Notes (Signed)
Nutrition Brief Note  Chart reviewed. Pt full comfort care.  No further nutrition interventions warranted at this time.  Please re-consult as needed.   Roslyn Smiling, MS, RD, LDN Pager # [phone] After hours/ weekend pager # [phone]

## 2019-08-06 NOTE — Plan of Care (Signed)
  Problem: Education: Goal: Knowledge of General Education information will improve Description: Including pain rating scale, medication(s)/side effects and non-pharmacologic comfort measures Outcome: Not Progressing   Problem: Health Behavior/Discharge Planning: Goal: Ability to manage health-related needs will improve Outcome: Not Progressing   Problem: Clinical Measurements: Goal: Ability to maintain clinical measurements within normal limits will improve Outcome: Not Progressing Goal: Will remain free from infection Outcome: Not Progressing Goal: Diagnostic test results will improve Outcome: Not Progressing Goal: Respiratory complications will improve Outcome: Not Progressing Goal: Cardiovascular complication will be avoided Outcome: Not Progressing   Problem: Activity: Goal: Risk for activity intolerance will decrease Outcome: Not Progressing   Problem: Nutrition: Goal: Adequate nutrition will be maintained Outcome: Not Progressing   Problem: Coping: Goal: Level of anxiety will decrease Outcome: Not Progressing   Problem: Elimination: Goal: Will not experience complications related to bowel motility Outcome: Not Progressing Goal: Will not experience complications related to urinary retention Outcome: Not Progressing   Problem: Pain Managment: Goal: General experience of comfort will improve Outcome: Not Progressing   Problem: Safety: Goal: Ability to remain free from injury will improve Outcome: Not Progressing   Problem: Skin Integrity: Goal: Risk for impaired skin integrity will decrease Outcome: Not Progressing   Problem: Education: Goal: Knowledge of risk factors and measures for prevention of condition will improve Outcome: Not Progressing   Problem: Coping: Goal: Psychosocial and spiritual needs will be supported Outcome: Not Progressing   Problem: Respiratory: Goal: Will maintain a patent airway Outcome: Not Progressing Goal: Complications  related to the disease process, condition or treatment will be avoided or minimized Outcome: Not Progressing   Problem: Education: Goal: Knowledge of the prescribed therapeutic regimen will improve Outcome: Not Progressing   Problem: Coping: Goal: Ability to identify and develop effective coping behavior will improve Outcome: Not Progressing   Problem: Clinical Measurements: Goal: Quality of life will improve Outcome: Not Progressing   Problem: Respiratory: Goal: Verbalizations of increased ease of respirations will increase Outcome: Not Progressing   Problem: Pain Management: Goal: Satisfaction with pain management regimen will improve Outcome: Not Progressing   

## 2019-08-06 DEATH — deceased

## 2020-09-19 IMAGING — XA IR IVC FILTER PLMT / S&I /IMG GUID/MOD SED
3 series · 14 of 16 positions shown · IV contrast (IODINE)
Comparison: none

INDICATION: 78-year-old male with a history of hemorrhage on anticoagulation.

[Series 2: body 4 care · 7 of 8 slices shown (1 of 2)]
[im 1/8]
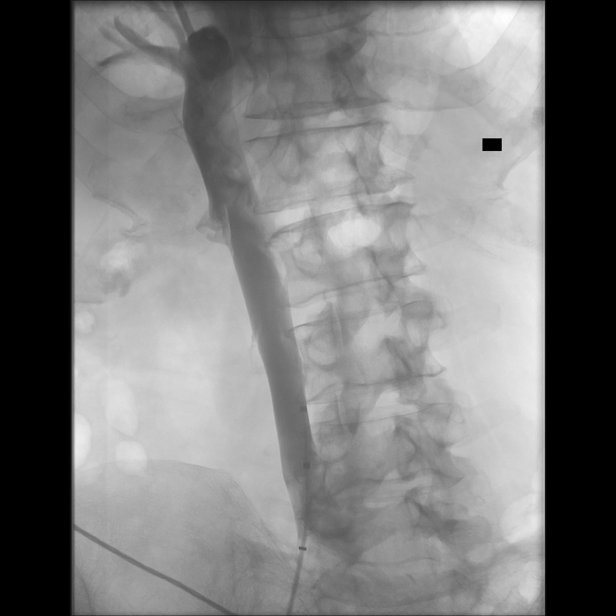
[im 2/8]
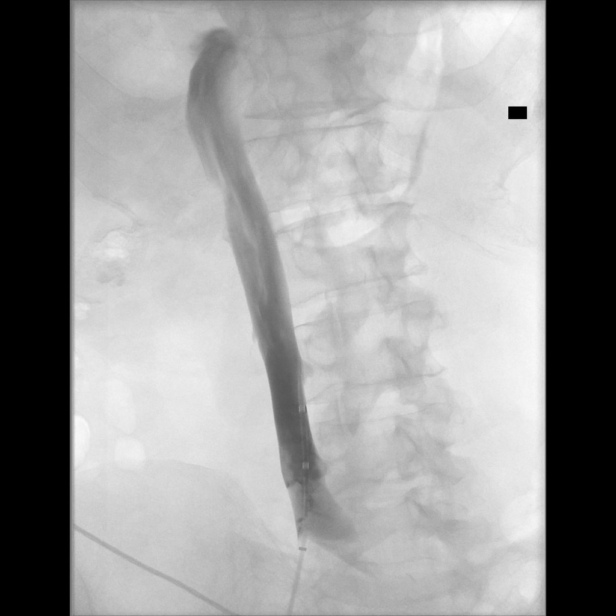
[im 3/8]
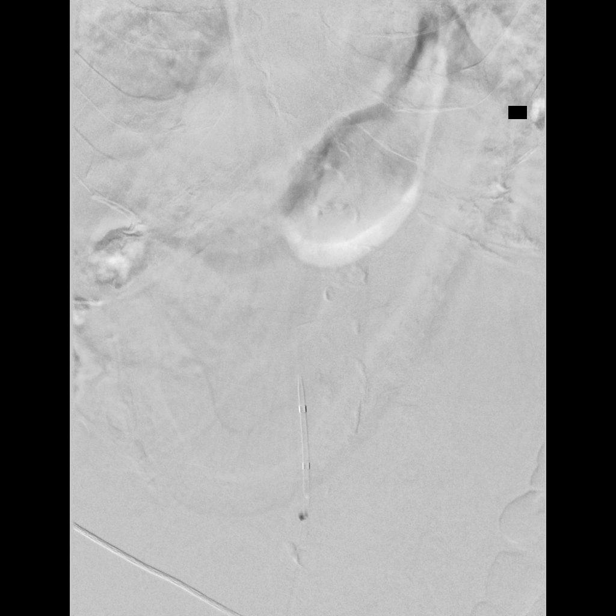
[im 5/8]
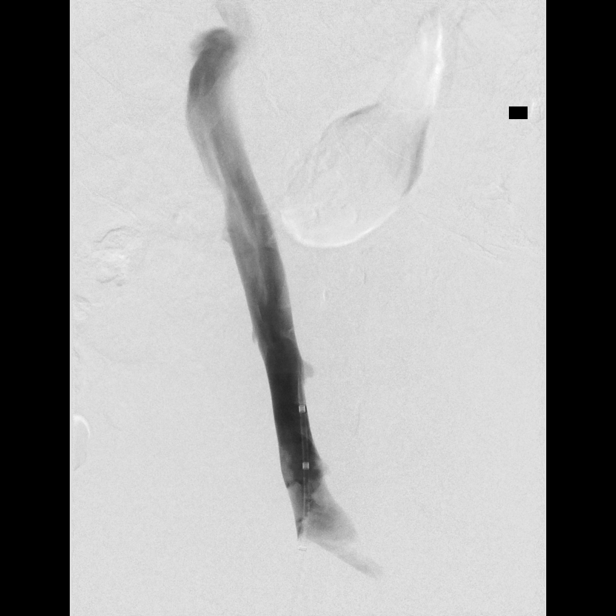
[im 6/8]
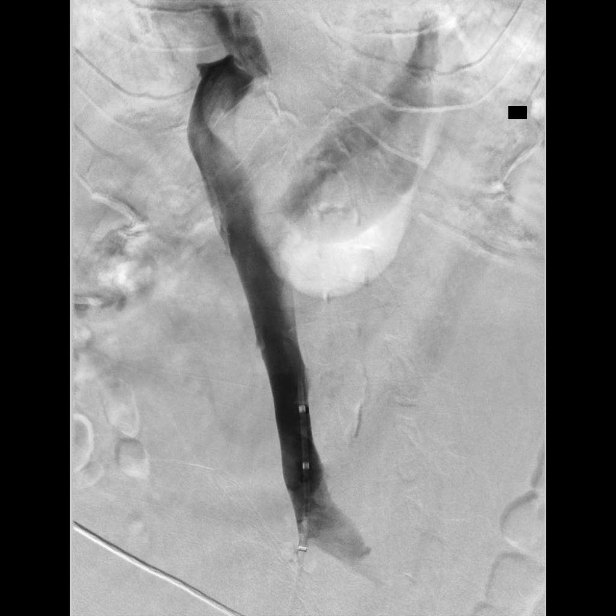
[im 7/8]
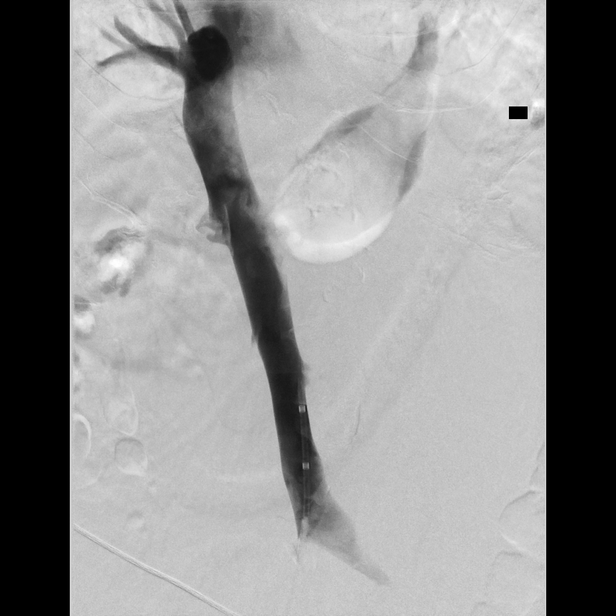
[im 8/8]
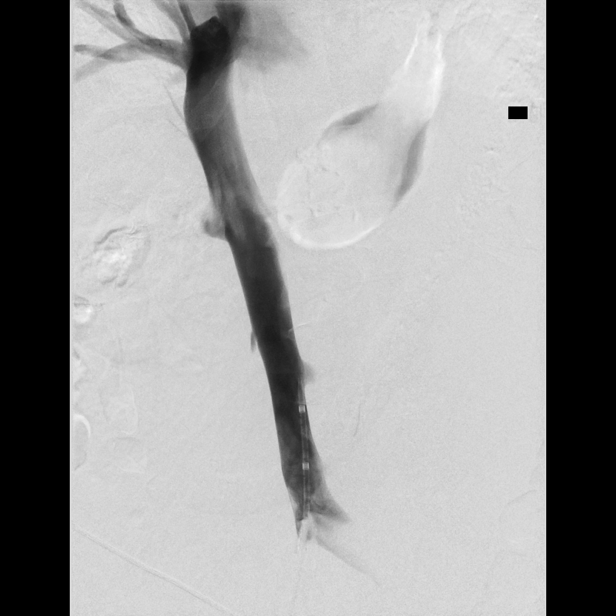

[Series 3: fl (-) angio · 1 of 1 slices shown]
[im 1/1]
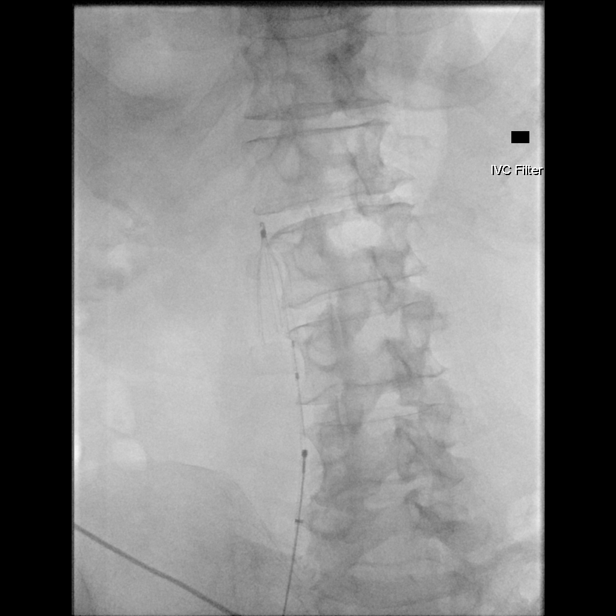

[Series 4: body 4 care · 6 of 7 slices shown (2 of 2)]
[im 1/7]
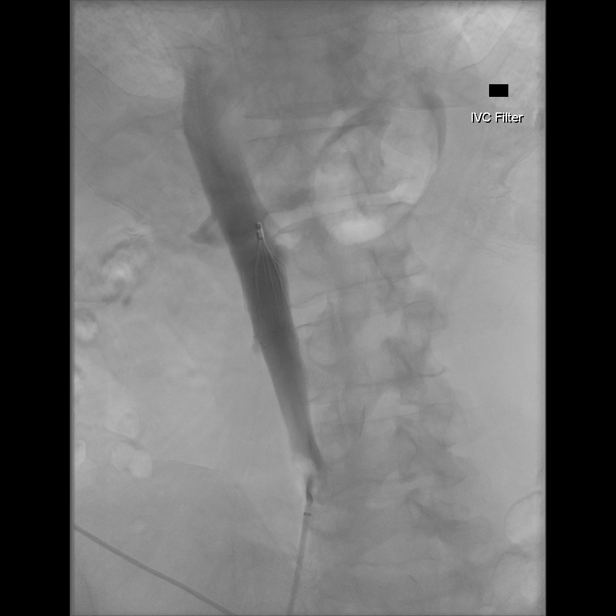
[im 2/7]
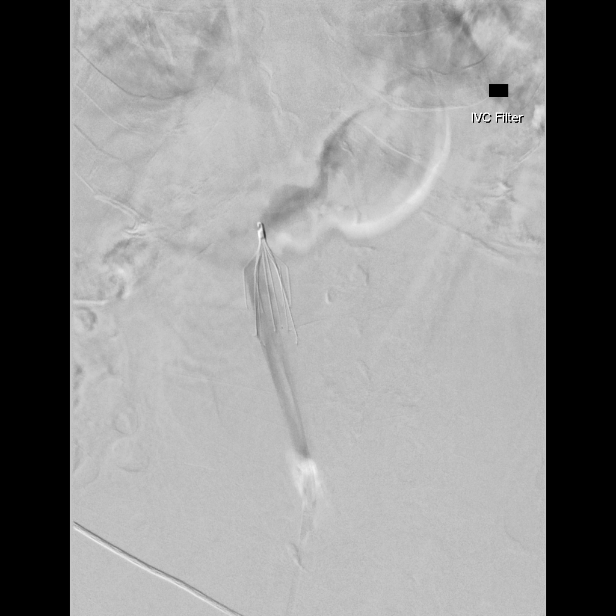
[im 4/7]
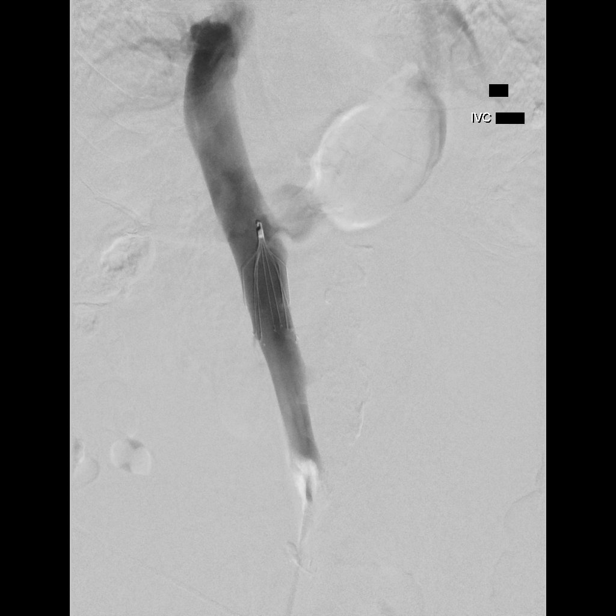
[im 5/7]
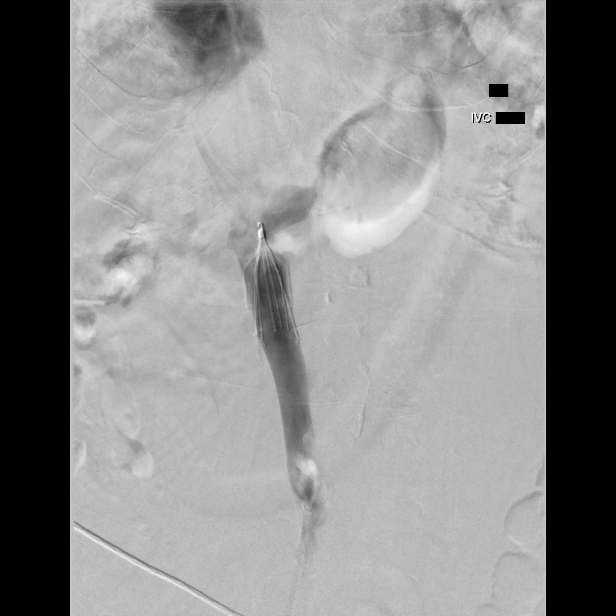
[im 6/7]
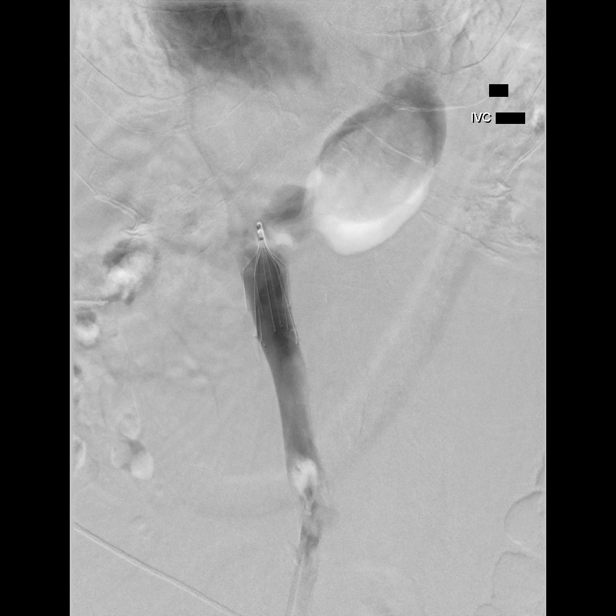
[im 7/7]
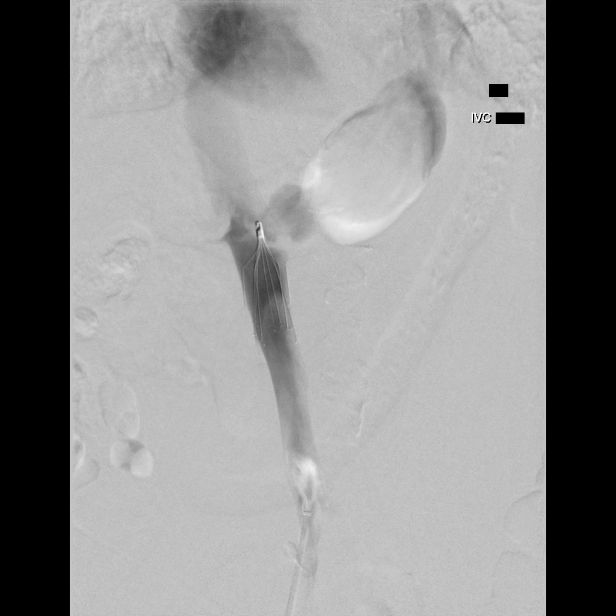

[14 of 16 positions shown; findings below may reference images not displayed]

EXAM:
IMAGE GUIDED PLACEMENT OF RETRIEVABLE IVC FILTER

ULTRASOUND GUIDED ACCESS RIGHT COMMON FEMORAL VEIN

MEDICATIONS:
None.

ANESTHESIA/SEDATION:
None

FLUOROSCOPY TIME:  Fluoroscopy Time: 0 minutes 24 seconds (39 mGy).

COMPLICATIONS:
9 none

PROCEDURE:
The procedure, risks, benefits, and alternatives were explained to
the patient. Specific risks discussed include bleeding, infection,
contrast reaction, renal failure, IVC filter fracture, migration,
iliocaval thrombus (3-4% incidence), need for further procedure,
need for further surgery, pulmonary embolism, cardiopulmonary
collapse, death. Questions regarding the procedure were encouraged
and answered. The patient understands and consents to the procedure.

Ultrasound survey was performed with images stored and sent to PACs.

The right inguinal region was prepped with chlorhexidine in a
sterile fashion, and a sterile drape was applied covering the
operative field. A sterile gown and sterile gloves were used for the
procedure. Local anesthesia was provided with 1% Lidocaine.

A single wall needle was used access the right common femoral vein
under ultrasound. With excellent venous blood flow returned, 035
wire was passed through the needle. Small incision was made with an
11 blade scalpel. The needle was removed, and dilation was performed
over the wire.

The delivery sheath for a retrievable Kao Hashimoto filter was passed
over the [place] wire into the IVC. The wire was removed and small
contrast was used to confirm IVC location.

IVC cavagram performed.

Dilator was removed, and the IVC filter was then delivered,
positioned below the lowest renal vein. Repeat cavagram performed,
and the catheter was removed.

Manual pressure was used for hemostasis.

Patient tolerated the procedure well and remained hemodynamically
stable throughout.

No complications were encountered and no significant blood loss was
encounter.
IMPRESSION: Status post ultrasound guided access right common femoral vein 4
deployment of retrievable IVC filter.

PLAN:
This IVC filter is potentially retrievable. The patient will be
approximately 8-12 weeks. Further recommendations regarding filter
retrieval, continued surveillance or declaration of device
permanence, will be made at that time.
